# Patient Record
Sex: Male | Born: 1935 | Race: Black or African American | Hispanic: No | Marital: Married | Smoking: Never smoker
Health system: Southern US, Community
[De-identification: ages and names within clinical notes are randomized; demographics above are authoritative.]

## PROBLEM LIST (undated history)

## (undated) DIAGNOSIS — I10 Essential (primary) hypertension: Secondary | ICD-10-CM

## (undated) DIAGNOSIS — E119 Type 2 diabetes mellitus without complications: Secondary | ICD-10-CM

## (undated) DIAGNOSIS — M7989 Other specified soft tissue disorders: Secondary | ICD-10-CM

## (undated) HISTORY — PX: OTHER SURGICAL HISTORY: SHX169

## (undated) HISTORY — DX: Other specified soft tissue disorders: M79.89

## (undated) HISTORY — DX: Type 2 diabetes mellitus without complications: E11.9

## (undated) HISTORY — DX: Essential (primary) hypertension: I10

---

## 2011-02-05 ENCOUNTER — Inpatient Hospital Stay: Payer: Self-pay | Admitting: Specialist

## 2012-04-27 ENCOUNTER — Ambulatory Visit: Payer: Self-pay | Admitting: Family Medicine

## 2012-05-15 ENCOUNTER — Emergency Department: Payer: Self-pay | Admitting: Emergency Medicine

## 2012-10-29 ENCOUNTER — Ambulatory Visit: Payer: Self-pay | Admitting: Radiation Oncology

## 2012-11-09 ENCOUNTER — Emergency Department: Payer: Self-pay | Admitting: Emergency Medicine

## 2012-11-24 ENCOUNTER — Inpatient Hospital Stay: Payer: Self-pay | Admitting: Internal Medicine

## 2012-11-24 LAB — CBC WITH DIFFERENTIAL/PLATELET
Basophil %: 0.3 %
Eosinophil %: 0 %
HCT: 31.1 % — ABNORMAL LOW (ref 40.0–52.0)
HGB: 10.1 g/dL — ABNORMAL LOW (ref 13.0–18.0)
Lymphocyte #: 0.7 10*3/uL — ABNORMAL LOW (ref 1.0–3.6)
MCH: 30.5 pg (ref 26.0–34.0)
MCHC: 32.4 g/dL (ref 32.0–36.0)
MCV: 94 fL (ref 80–100)
Monocyte #: 1 x10 3/mm (ref 0.2–1.0)
Monocyte %: 8.8 %
Neutrophil %: 84.6 %
RBC: 3.31 10*6/uL — ABNORMAL LOW (ref 4.40–5.90)

## 2012-11-24 LAB — URINALYSIS, COMPLETE
Ketone: NEGATIVE
Nitrite: NEGATIVE
Protein: 100
RBC,UR: 11 /HPF (ref 0–5)
Squamous Epithelial: 3
WBC UR: 750 /HPF (ref 0–5)

## 2012-11-24 LAB — COMPREHENSIVE METABOLIC PANEL
Albumin: 3.1 g/dL — ABNORMAL LOW (ref 3.4–5.0)
Alkaline Phosphatase: 71 U/L (ref 50–136)
BUN: 61 mg/dL — ABNORMAL HIGH (ref 7–18)
Calcium, Total: 9.1 mg/dL (ref 8.5–10.1)
Chloride: 106 mmol/L (ref 98–107)
Co2: 23 mmol/L (ref 21–32)
Creatinine: 2.17 mg/dL — ABNORMAL HIGH (ref 0.60–1.30)
EGFR (African American): 31 — ABNORMAL LOW
EGFR (Non-African Amer.): 26 — ABNORMAL LOW
Osmolality: 299 (ref 275–301)
Potassium: 3.7 mmol/L (ref 3.5–5.1)
SGOT(AST): 16 U/L (ref 15–37)
Total Protein: 7.8 g/dL (ref 6.4–8.2)

## 2012-11-24 LAB — PROTIME-INR
INR: 1
Prothrombin Time: 13.6 secs (ref 11.5–14.7)

## 2012-11-25 LAB — CBC WITH DIFFERENTIAL/PLATELET
Basophil %: 0.6 %
Eosinophil #: 0 10*3/uL (ref 0.0–0.7)
Eosinophil %: 0.3 %
HGB: 9.8 g/dL — ABNORMAL LOW (ref 13.0–18.0)
MCH: 32 pg (ref 26.0–34.0)
Monocyte #: 0.9 x10 3/mm (ref 0.2–1.0)
Monocyte %: 10.1 %
Neutrophil #: 7.4 10*3/uL — ABNORMAL HIGH (ref 1.4–6.5)
RDW: 12.7 % (ref 11.5–14.5)
WBC: 9.3 10*3/uL (ref 3.8–10.6)

## 2012-11-25 LAB — BASIC METABOLIC PANEL
Anion Gap: 10 (ref 7–16)
Calcium, Total: 8.4 mg/dL — ABNORMAL LOW (ref 8.5–10.1)
Co2: 21 mmol/L (ref 21–32)
EGFR (African American): 40 — ABNORMAL LOW
EGFR (Non-African Amer.): 34 — ABNORMAL LOW
Glucose: 156 mg/dL — ABNORMAL HIGH (ref 65–99)
Osmolality: 300 (ref 275–301)
Sodium: 140 mmol/L (ref 136–145)

## 2012-11-26 LAB — CREATININE, SERUM
Creatinine: 1.29 mg/dL (ref 0.60–1.30)
EGFR (African American): 57 — ABNORMAL LOW

## 2012-11-27 LAB — HEMOGLOBIN: HGB: 9.6 g/dL — ABNORMAL LOW (ref 13.0–18.0)

## 2012-11-27 LAB — BASIC METABOLIC PANEL
Anion Gap: 7 (ref 7–16)
BUN: 22 mg/dL — ABNORMAL HIGH (ref 7–18)
Calcium, Total: 8.7 mg/dL (ref 8.5–10.1)
Co2: 24 mmol/L (ref 21–32)
Creatinine: 1.05 mg/dL (ref 0.60–1.30)
EGFR (African American): >60
EGFR (Non-African Amer.): >60
Glucose: 141 mg/dL — ABNORMAL HIGH (ref 65–99)
Osmolality: 289 (ref 275–301)
Potassium: 4.3 mmol/L (ref 3.5–5.1)

## 2012-11-27 LAB — PROTIME-INR
INR: 1.3
Prothrombin Time: 17 secs — ABNORMAL HIGH (ref 11.5–14.7)

## 2012-11-28 LAB — PROTIME-INR
INR: 1.4
Prothrombin Time: 17.6 secs — ABNORMAL HIGH (ref 11.5–14.7)

## 2012-11-29 LAB — PROTIME-INR
INR: 1.5
Prothrombin Time: 18.3 secs — ABNORMAL HIGH (ref 11.5–14.7)

## 2012-12-09 ENCOUNTER — Ambulatory Visit: Payer: Self-pay | Admitting: Hematology and Oncology

## 2012-12-11 ENCOUNTER — Ambulatory Visit: Payer: Self-pay | Admitting: Hematology and Oncology

## 2012-12-11 LAB — COMPREHENSIVE METABOLIC PANEL
Albumin: 3.3 g/dL — ABNORMAL LOW (ref 3.4–5.0)
Alkaline Phosphatase: 85 U/L (ref 50–136)
BUN: 20 mg/dL — ABNORMAL HIGH (ref 7–18)
Co2: 30 mmol/L (ref 21–32)
EGFR (African American): 55 — ABNORMAL LOW
EGFR (Non-African Amer.): 48 — ABNORMAL LOW
Glucose: 121 mg/dL — ABNORMAL HIGH (ref 65–99)
SGOT(AST): 13 U/L — ABNORMAL LOW (ref 15–37)
SGPT (ALT): 18 U/L (ref 12–78)
Total Protein: 8.1 g/dL (ref 6.4–8.2)

## 2012-12-11 LAB — CBC CANCER CENTER
Basophil #: 0.1 x10 3/mm (ref 0.0–0.1)
Eosinophil #: 0 x10 3/mm (ref 0.0–0.7)
Eosinophil %: 0.5 %
HCT: 28.8 % — ABNORMAL LOW (ref 40.0–52.0)
Lymphocyte #: 1.2 x10 3/mm (ref 1.0–3.6)
MCV: 92 fL (ref 80–100)
Monocyte #: 0.5 x10 3/mm (ref 0.2–1.0)
Monocyte %: 8.2 %
Neutrophil #: 4.5 x10 3/mm (ref 1.4–6.5)
Platelet: 514 x10 3/mm — ABNORMAL HIGH (ref 150–440)
RDW: 13.6 % (ref 11.5–14.5)

## 2012-12-11 LAB — FERRITIN: Ferritin (ARMC): 354 ng/mL (ref 8–388)

## 2012-12-11 LAB — IRON AND TIBC
Iron Bind.Cap.(Total): 247 ug/dL — ABNORMAL LOW (ref 250–450)
Iron: 47 ug/dL — ABNORMAL LOW (ref 65–175)
Unbound Iron-Bind.Cap.: 200 ug/dL

## 2012-12-11 LAB — TSH: Thyroid Stimulating Horm: 4.43 u[IU]/mL

## 2012-12-12 ENCOUNTER — Ambulatory Visit: Payer: Self-pay | Admitting: Hematology and Oncology

## 2012-12-23 LAB — CBC CANCER CENTER
Basophil #: 0 x10 3/mm (ref 0.0–0.1)
Basophil %: 0.3 %
HCT: 30.9 % — ABNORMAL LOW (ref 40.0–52.0)
HGB: 10.3 g/dL — ABNORMAL LOW (ref 13.0–18.0)
Lymphocyte #: 1.2 x10 3/mm (ref 1.0–3.6)
MCH: 30.9 pg (ref 26.0–34.0)
MCHC: 33.2 g/dL (ref 32.0–36.0)
MCV: 93 fL (ref 80–100)
Neutrophil %: 66.9 %
RBC: 3.32 10*6/uL — ABNORMAL LOW (ref 4.40–5.90)
RDW: 14.4 % (ref 11.5–14.5)
WBC: 6.1 x10 3/mm (ref 3.8–10.6)

## 2012-12-23 LAB — BASIC METABOLIC PANEL
Anion Gap: 9 (ref 7–16)
BUN: 18 mg/dL (ref 7–18)
Chloride: 103 mmol/L (ref 98–107)
Co2: 28 mmol/L (ref 21–32)
Creatinine: 1.21 mg/dL (ref 0.60–1.30)
EGFR (African American): >60
EGFR (Non-African Amer.): 54 — ABNORMAL LOW
Glucose: 144 mg/dL — ABNORMAL HIGH (ref 65–99)
Osmolality: 284 (ref 275–301)
Potassium: 4.5 mmol/L (ref 3.5–5.1)

## 2013-01-09 ENCOUNTER — Ambulatory Visit: Payer: Self-pay | Admitting: Hematology and Oncology

## 2013-01-22 LAB — CBC CANCER CENTER
Eosinophil #: 0.1 x10 3/mm (ref 0.0–0.7)
HCT: 34.9 % — ABNORMAL LOW (ref 40.0–52.0)
MCH: 30.9 pg (ref 26.0–34.0)
MCHC: 33.1 g/dL (ref 32.0–36.0)
MCV: 93 fL (ref 80–100)
Monocyte %: 11.7 %
Neutrophil #: 3 x10 3/mm (ref 1.4–6.5)
Neutrophil %: 62.1 %
Platelet: 278 x10 3/mm (ref 150–440)
RDW: 14.6 % — ABNORMAL HIGH (ref 11.5–14.5)
WBC: 4.9 x10 3/mm (ref 3.8–10.6)

## 2013-01-22 LAB — BASIC METABOLIC PANEL
Anion Gap: 9 (ref 7–16)
BUN: 25 mg/dL — ABNORMAL HIGH (ref 7–18)
Calcium, Total: 9.4 mg/dL (ref 8.5–10.1)
Chloride: 104 mmol/L (ref 98–107)
Co2: 27 mmol/L (ref 21–32)
Creatinine: 1.45 mg/dL — ABNORMAL HIGH (ref 0.60–1.30)
EGFR (African American): 50 — ABNORMAL LOW
EGFR (Non-African Amer.): 43 — ABNORMAL LOW
Glucose: 124 mg/dL — ABNORMAL HIGH (ref 65–99)
Osmolality: 285 (ref 275–301)
Potassium: 4.7 mmol/L (ref 3.5–5.1)
Sodium: 140 mmol/L (ref 136–145)

## 2013-01-23 LAB — PSA: PSA: 55.1 ng/mL — ABNORMAL HIGH (ref 0.0–4.0)

## 2013-02-09 ENCOUNTER — Ambulatory Visit: Payer: Self-pay | Admitting: Hematology and Oncology

## 2013-02-19 LAB — CBC CANCER CENTER
Eosinophil #: 0.1 x10 3/mm (ref 0.0–0.7)
Eosinophil %: 1.2 %
HCT: 33.3 % — ABNORMAL LOW (ref 40.0–52.0)
HGB: 10.8 g/dL — ABNORMAL LOW (ref 13.0–18.0)
Lymphocyte #: 1.2 x10 3/mm (ref 1.0–3.6)
MCHC: 32.5 g/dL (ref 32.0–36.0)
MCV: 92 fL (ref 80–100)
Monocyte #: 0.7 x10 3/mm (ref 0.2–1.0)
Platelet: 277 x10 3/mm (ref 150–440)
RBC: 3.64 10*6/uL — ABNORMAL LOW (ref 4.40–5.90)
RDW: 14 % (ref 11.5–14.5)

## 2013-02-19 LAB — BASIC METABOLIC PANEL
Anion Gap: 10 (ref 7–16)
BUN: 24 mg/dL — ABNORMAL HIGH (ref 7–18)
Calcium, Total: 8.6 mg/dL (ref 8.5–10.1)
Chloride: 104 mmol/L (ref 98–107)
Creatinine: 1.32 mg/dL — ABNORMAL HIGH (ref 0.60–1.30)
EGFR (African American): 55 — ABNORMAL LOW
EGFR (Non-African Amer.): 48 — ABNORMAL LOW
Glucose: 119 mg/dL — ABNORMAL HIGH (ref 65–99)
Osmolality: 281 (ref 275–301)
Sodium: 138 mmol/L (ref 136–145)

## 2013-03-11 ENCOUNTER — Ambulatory Visit: Payer: Self-pay | Admitting: Hematology and Oncology

## 2013-03-19 LAB — CBC CANCER CENTER
Basophil #: 0 x10 3/mm (ref 0.0–0.1)
Eosinophil #: 0.1 x10 3/mm (ref 0.0–0.7)
Eosinophil %: 1.9 %
HCT: 32.1 % — ABNORMAL LOW (ref 40.0–52.0)
HGB: 10.7 g/dL — ABNORMAL LOW (ref 13.0–18.0)
Lymphocyte #: 1.4 x10 3/mm (ref 1.0–3.6)
Lymphocyte %: 28.1 %
MCH: 30.3 pg (ref 26.0–34.0)
MCV: 91 fL (ref 80–100)
Monocyte %: 12.8 %
Neutrophil #: 2.9 x10 3/mm (ref 1.4–6.5)
Neutrophil %: 56.4 %
Platelet: 256 x10 3/mm (ref 150–440)
RBC: 3.53 10*6/uL — ABNORMAL LOW (ref 4.40–5.90)

## 2013-03-19 LAB — COMPREHENSIVE METABOLIC PANEL
Albumin: 3.9 g/dL (ref 3.4–5.0)
Bilirubin,Total: 0.3 mg/dL (ref 0.2–1.0)
SGPT (ALT): 22 U/L (ref 12–78)

## 2013-03-19 LAB — BASIC METABOLIC PANEL
Anion Gap: 10 (ref 7–16)
BUN: 31 mg/dL — ABNORMAL HIGH (ref 7–18)
Chloride: 104 mmol/L (ref 98–107)
Co2: 25 mmol/L (ref 21–32)
EGFR (Non-African Amer.): 40 — ABNORMAL LOW
Glucose: 121 mg/dL — ABNORMAL HIGH (ref 65–99)
Osmolality: 285 (ref 275–301)
Sodium: 139 mmol/L (ref 136–145)

## 2013-03-20 LAB — PSA: PSA: 3.1 ng/mL (ref 0.0–4.0)

## 2013-04-11 ENCOUNTER — Ambulatory Visit: Payer: Self-pay | Admitting: Hematology and Oncology

## 2013-04-16 LAB — CBC CANCER CENTER
Basophil %: 1.3 %
Eosinophil #: 0.1 x10 3/mm (ref 0.0–0.7)
Eosinophil %: 2.3 %
HCT: 29.2 % — ABNORMAL LOW (ref 40.0–52.0)
Lymphocyte #: 1.3 x10 3/mm (ref 1.0–3.6)
MCHC: 33.5 g/dL (ref 32.0–36.0)
MCV: 93 fL (ref 80–100)
RDW: 13.5 % (ref 11.5–14.5)
WBC: 5 x10 3/mm (ref 3.8–10.6)

## 2013-04-16 LAB — BASIC METABOLIC PANEL
Anion Gap: 6 — ABNORMAL LOW (ref 7–16)
Calcium, Total: 9.2 mg/dL (ref 8.5–10.1)
EGFR (Non-African Amer.): 31 — ABNORMAL LOW
Osmolality: 280 (ref 275–301)
Sodium: 134 mmol/L — ABNORMAL LOW (ref 136–145)

## 2013-04-17 LAB — PSA: PSA: 2.4 ng/mL (ref 0.0–4.0)

## 2013-05-11 ENCOUNTER — Ambulatory Visit: Payer: Self-pay | Admitting: Hematology and Oncology

## 2013-05-21 LAB — BASIC METABOLIC PANEL
BUN: 39 mg/dL — ABNORMAL HIGH (ref 7–18)
Chloride: 101 mmol/L (ref 98–107)
Creatinine: 2.09 mg/dL — ABNORMAL HIGH (ref 0.60–1.30)
EGFR (African American): 32 — ABNORMAL LOW
EGFR (Non-African Amer.): 27 — ABNORMAL LOW
Glucose: 117 mg/dL — ABNORMAL HIGH (ref 65–99)
Potassium: 4.8 mmol/L (ref 3.5–5.1)
Sodium: 137 mmol/L (ref 136–145)

## 2013-05-21 LAB — CBC CANCER CENTER
Basophil #: 0.1 x10 3/mm (ref 0.0–0.1)
Basophil %: 1.4 %
Eosinophil #: 0.1 x10 3/mm (ref 0.0–0.7)
Eosinophil %: 1.6 %
HCT: 28.6 % — ABNORMAL LOW (ref 40.0–52.0)
Lymphocyte #: 1.2 x10 3/mm (ref 1.0–3.6)
Lymphocyte %: 22 %
MCH: 31.9 pg (ref 26.0–34.0)
MCV: 93 fL (ref 80–100)
Monocyte #: 0.6 x10 3/mm (ref 0.2–1.0)
Neutrophil #: 3.3 x10 3/mm (ref 1.4–6.5)
Platelet: 270 x10 3/mm (ref 150–440)
RBC: 3.07 10*6/uL — ABNORMAL LOW (ref 4.40–5.90)
WBC: 5.3 x10 3/mm (ref 3.8–10.6)

## 2013-05-22 LAB — PSA: PSA: 2.4 ng/mL (ref 0.0–4.0)

## 2013-06-11 ENCOUNTER — Ambulatory Visit: Payer: Self-pay | Admitting: Hematology and Oncology

## 2013-07-12 ENCOUNTER — Ambulatory Visit: Payer: Self-pay | Admitting: Hematology and Oncology

## 2013-07-16 LAB — CBC CANCER CENTER
HCT: 29.3 % — ABNORMAL LOW (ref 40.0–52.0)
Lymphocyte #: 1.2 x10 3/mm (ref 1.0–3.6)
MCH: 31.1 pg (ref 26.0–34.0)
MCHC: 33.5 g/dL (ref 32.0–36.0)
MCV: 93 fL (ref 80–100)
Monocyte %: 13.9 %
Neutrophil %: 57.5 %
RBC: 3.16 10*6/uL — ABNORMAL LOW (ref 4.40–5.90)
RDW: 13 % (ref 11.5–14.5)

## 2013-07-16 LAB — BASIC METABOLIC PANEL
Calcium, Total: 9.1 mg/dL (ref 8.5–10.1)
Chloride: 104 mmol/L (ref 98–107)
Co2: 24 mmol/L (ref 21–32)
Osmolality: 293 (ref 275–301)

## 2013-08-11 ENCOUNTER — Ambulatory Visit: Payer: Self-pay | Admitting: Hematology and Oncology

## 2013-08-13 LAB — BASIC METABOLIC PANEL
Anion Gap: 10 (ref 7–16)
Chloride: 102 mmol/L (ref 98–107)
Co2: 26 mmol/L (ref 21–32)
EGFR (African American): 44 — ABNORMAL LOW
Glucose: 278 mg/dL — ABNORMAL HIGH (ref 65–99)
Osmolality: 292 (ref 275–301)

## 2013-08-13 LAB — CBC CANCER CENTER
Eosinophil #: 0.1 x10 3/mm (ref 0.0–0.7)
HGB: 9.9 g/dL — ABNORMAL LOW (ref 13.0–18.0)
Lymphocyte #: 0.9 x10 3/mm — ABNORMAL LOW (ref 1.0–3.6)
MCHC: 33.4 g/dL (ref 32.0–36.0)
MCV: 93 fL (ref 80–100)
Monocyte #: 0.5 x10 3/mm (ref 0.2–1.0)
Monocyte %: 10.5 %
RBC: 3.2 10*6/uL — ABNORMAL LOW (ref 4.40–5.90)
RDW: 13.6 % (ref 11.5–14.5)

## 2013-09-11 ENCOUNTER — Ambulatory Visit: Payer: Self-pay | Admitting: Hematology and Oncology

## 2013-10-08 LAB — BASIC METABOLIC PANEL
Anion Gap: 11 (ref 7–16)
Co2: 25 mmol/L (ref 21–32)
Creatinine: 1.89 mg/dL — ABNORMAL HIGH (ref 0.60–1.30)
Osmolality: 289 (ref 275–301)
Sodium: 137 mmol/L (ref 136–145)

## 2013-10-08 LAB — CBC CANCER CENTER
Basophil %: 1.2 %
Eosinophil #: 0.1 x10 3/mm (ref 0.0–0.7)
Eosinophil %: 1.9 %
HCT: 29.5 % — ABNORMAL LOW (ref 40.0–52.0)
MCHC: 32.7 g/dL (ref 32.0–36.0)
MCV: 91 fL (ref 80–100)
Monocyte #: 0.5 x10 3/mm (ref 0.2–1.0)
Monocyte %: 11.3 %
Neutrophil #: 3.2 x10 3/mm (ref 1.4–6.5)
RBC: 3.24 10*6/uL — ABNORMAL LOW (ref 4.40–5.90)

## 2013-10-11 ENCOUNTER — Ambulatory Visit: Payer: Self-pay | Admitting: Hematology and Oncology

## 2013-10-11 LAB — PSA: PSA: 1.8 ng/mL (ref 0.0–4.0)

## 2013-11-05 LAB — BASIC METABOLIC PANEL
Anion Gap: 9 (ref 7–16)
Calcium, Total: 8.7 mg/dL (ref 8.5–10.1)
Chloride: 105 mmol/L (ref 98–107)
Co2: 24 mmol/L (ref 21–32)
Creatinine: 2.2 mg/dL — ABNORMAL HIGH (ref 0.60–1.30)
EGFR (African American): 30 — ABNORMAL LOW
EGFR (Non-African Amer.): 26 — ABNORMAL LOW
Potassium: 4.4 mmol/L (ref 3.5–5.1)
Sodium: 138 mmol/L (ref 136–145)

## 2013-11-05 LAB — CBC CANCER CENTER
Basophil %: 0.6 %
Eosinophil #: 0.1 x10 3/mm (ref 0.0–0.7)
HCT: 28.7 % — ABNORMAL LOW (ref 40.0–52.0)
HGB: 9.3 g/dL — ABNORMAL LOW (ref 13.0–18.0)
Lymphocyte #: 1.3 x10 3/mm (ref 1.0–3.6)
MCV: 90 fL (ref 80–100)
Monocyte #: 0.8 x10 3/mm (ref 0.2–1.0)
Monocyte %: 12.9 %
Neutrophil #: 4.1 x10 3/mm (ref 1.4–6.5)
Platelet: 298 x10 3/mm (ref 150–440)
RBC: 3.19 10*6/uL — ABNORMAL LOW (ref 4.40–5.90)
RDW: 14.2 % (ref 11.5–14.5)
WBC: 6.3 x10 3/mm (ref 3.8–10.6)

## 2013-11-08 LAB — PSA: PSA: 1.3 ng/mL (ref 0.0–4.0)

## 2013-11-11 ENCOUNTER — Ambulatory Visit: Payer: Self-pay | Admitting: Hematology and Oncology

## 2013-11-16 LAB — BASIC METABOLIC PANEL
ANION GAP: 13 (ref 7–16)
BUN: 32 mg/dL — AB (ref 7–18)
CO2: 23 mmol/L (ref 21–32)
Calcium, Total: 9.5 mg/dL (ref 8.5–10.1)
Chloride: 103 mmol/L (ref 98–107)
Creatinine: 1.76 mg/dL — ABNORMAL HIGH (ref 0.60–1.30)
EGFR (African American): 39 — ABNORMAL LOW
EGFR (Non-African Amer.): 34 — ABNORMAL LOW
GLUCOSE: 133 mg/dL — AB (ref 65–99)
Osmolality: 286 (ref 275–301)
POTASSIUM: 4.6 mmol/L (ref 3.5–5.1)
Sodium: 139 mmol/L (ref 136–145)

## 2013-11-16 LAB — CBC CANCER CENTER
Basophil #: 0 x10 3/mm (ref 0.0–0.1)
Basophil %: 0.3 %
Eosinophil #: 0.1 x10 3/mm (ref 0.0–0.7)
Eosinophil %: 2.1 %
HCT: 29.9 % — ABNORMAL LOW (ref 40.0–52.0)
HGB: 9.4 g/dL — ABNORMAL LOW (ref 13.0–18.0)
Lymphocyte #: 1.4 x10 3/mm (ref 1.0–3.6)
Lymphocyte %: 21.8 %
MCH: 28.7 pg (ref 26.0–34.0)
MCHC: 31.6 g/dL — ABNORMAL LOW (ref 32.0–36.0)
MCV: 91 fL (ref 80–100)
Monocyte #: 0.6 x10 3/mm (ref 0.2–1.0)
Monocyte %: 8.8 %
Neutrophil #: 4.2 x10 3/mm (ref 1.4–6.5)
Neutrophil %: 67 %
Platelet: 367 x10 3/mm (ref 150–440)
RBC: 3.29 10*6/uL — ABNORMAL LOW (ref 4.40–5.90)
RDW: 14.2 % (ref 11.5–14.5)
WBC: 6.3 x10 3/mm (ref 3.8–10.6)

## 2013-11-17 ENCOUNTER — Encounter: Payer: Self-pay | Admitting: Podiatry

## 2013-11-17 LAB — PSA: PSA: 1.3 ng/mL (ref 0.0–4.0)

## 2013-11-22 ENCOUNTER — Ambulatory Visit: Payer: Self-pay | Admitting: Podiatry

## 2013-11-24 ENCOUNTER — Ambulatory Visit: Payer: Self-pay | Admitting: Podiatry

## 2013-12-12 ENCOUNTER — Ambulatory Visit: Payer: Self-pay

## 2013-12-12 ENCOUNTER — Ambulatory Visit: Payer: Self-pay | Admitting: Hematology and Oncology

## 2013-12-14 LAB — CBC CANCER CENTER
Basophil #: 0 x10 3/mm (ref 0.0–0.1)
Basophil %: 0.4 %
Eosinophil #: 0.1 x10 3/mm (ref 0.0–0.7)
Eosinophil %: 2.2 %
HCT: 30.2 % — ABNORMAL LOW (ref 40.0–52.0)
HGB: 9.7 g/dL — ABNORMAL LOW (ref 13.0–18.0)
LYMPHS ABS: 1.3 x10 3/mm (ref 1.0–3.6)
LYMPHS PCT: 22 %
MCH: 28.7 pg (ref 26.0–34.0)
MCHC: 32 g/dL (ref 32.0–36.0)
MCV: 90 fL (ref 80–100)
MONO ABS: 0.6 x10 3/mm (ref 0.2–1.0)
Monocyte %: 10.9 %
Neutrophil #: 3.7 x10 3/mm (ref 1.4–6.5)
Neutrophil %: 64.5 %
Platelet: 281 x10 3/mm (ref 150–440)
RBC: 3.38 10*6/uL — AB (ref 4.40–5.90)
RDW: 14.3 % (ref 11.5–14.5)
WBC: 5.7 x10 3/mm (ref 3.8–10.6)

## 2013-12-14 LAB — BASIC METABOLIC PANEL
Anion Gap: 12 (ref 7–16)
BUN: 38 mg/dL — ABNORMAL HIGH (ref 7–18)
CALCIUM: 8.5 mg/dL (ref 8.5–10.1)
CO2: 22 mmol/L (ref 21–32)
Chloride: 105 mmol/L (ref 98–107)
Creatinine: 1.9 mg/dL — ABNORMAL HIGH (ref 0.60–1.30)
GFR CALC AF AMER: 36 — AB
GFR CALC NON AF AMER: 31 — AB
Glucose: 115 mg/dL — ABNORMAL HIGH (ref 65–99)
Osmolality: 288 (ref 275–301)
Potassium: 4.4 mmol/L (ref 3.5–5.1)
SODIUM: 139 mmol/L (ref 136–145)

## 2013-12-29 ENCOUNTER — Ambulatory Visit (INDEPENDENT_AMBULATORY_CARE_PROVIDER_SITE_OTHER): Payer: Commercial Managed Care - HMO | Admitting: Podiatry

## 2013-12-29 ENCOUNTER — Encounter: Payer: Self-pay | Admitting: Podiatry

## 2013-12-29 VITALS — BP 112/74 | HR 76 | Resp 16 | Ht 68.0 in | Wt 178.0 lb

## 2013-12-29 DIAGNOSIS — B351 Tinea unguium: Secondary | ICD-10-CM

## 2013-12-29 DIAGNOSIS — M79609 Pain in unspecified limb: Secondary | ICD-10-CM

## 2013-12-29 NOTE — Progress Notes (Signed)
Trim my  Toenails because they hurt him in a walk and he comes in.  Objective: Vital signs are stable he is alert and oriented x3. Pulses are palpable bilateral. Nails are thick yellow dystrophic lytic mycotic painful palpation. No lesions noted on the skin.  Assessment: Pain in limb secondary to onychomycosis 1 through 5 bilateral.  Plan: Debridement of nails 1 through 5 bilateral covered service secondary to pain I suggested he followup with [place] in 3 months.

## 2014-01-09 ENCOUNTER — Ambulatory Visit: Payer: Self-pay | Admitting: Hematology and Oncology

## 2014-01-11 LAB — CBC CANCER CENTER
BASOS ABS: 0 x10 3/mm (ref 0.0–0.1)
BASOS PCT: 0.5 %
Eosinophil #: 0.2 x10 3/mm (ref 0.0–0.7)
Eosinophil %: 3.5 %
HCT: 28 % — ABNORMAL LOW (ref 40.0–52.0)
HGB: 8.8 g/dL — AB (ref 13.0–18.0)
LYMPHS ABS: 1.4 x10 3/mm (ref 1.0–3.6)
Lymphocyte %: 28.2 %
MCH: 28.3 pg (ref 26.0–34.0)
MCHC: 31.6 g/dL — ABNORMAL LOW (ref 32.0–36.0)
MCV: 90 fL (ref 80–100)
MONO ABS: 0.5 x10 3/mm (ref 0.2–1.0)
Monocyte %: 10.1 %
Neutrophil #: 2.8 x10 3/mm (ref 1.4–6.5)
Neutrophil %: 57.7 %
Platelet: 254 x10 3/mm (ref 150–440)
RBC: 3.12 10*6/uL — ABNORMAL LOW (ref 4.40–5.90)
RDW: 15 % — AB (ref 11.5–14.5)
WBC: 4.9 x10 3/mm (ref 3.8–10.6)

## 2014-01-11 LAB — BASIC METABOLIC PANEL
Anion Gap: 13 (ref 7–16)
BUN: 53 mg/dL — ABNORMAL HIGH (ref 7–18)
CREATININE: 2.47 mg/dL — AB (ref 0.60–1.30)
Calcium, Total: 7.5 mg/dL — ABNORMAL LOW (ref 8.5–10.1)
Chloride: 109 mmol/L — ABNORMAL HIGH (ref 98–107)
Co2: 19 mmol/L — ABNORMAL LOW (ref 21–32)
EGFR (Non-African Amer.): 22 — ABNORMAL LOW
GFR CALC AF AMER: 26 — AB
Glucose: 156 mg/dL — ABNORMAL HIGH (ref 65–99)
Osmolality: 299 (ref 275–301)
POTASSIUM: 4.4 mmol/L (ref 3.5–5.1)
SODIUM: 141 mmol/L (ref 136–145)

## 2014-02-09 ENCOUNTER — Ambulatory Visit: Payer: Self-pay | Admitting: Hematology and Oncology

## 2014-02-11 LAB — CBC CANCER CENTER
Basophil #: 0 x10 3/mm (ref 0.0–0.1)
Basophil %: 0.9 %
EOS ABS: 0.2 x10 3/mm (ref 0.0–0.7)
Eosinophil %: 3.5 %
HCT: 27.5 % — ABNORMAL LOW (ref 40.0–52.0)
HGB: 8.7 g/dL — AB (ref 13.0–18.0)
LYMPHS ABS: 1.2 x10 3/mm (ref 1.0–3.6)
Lymphocyte %: 23.6 %
MCH: 28.6 pg (ref 26.0–34.0)
MCHC: 31.6 g/dL — AB (ref 32.0–36.0)
MCV: 91 fL (ref 80–100)
Monocyte #: 0.6 x10 3/mm (ref 0.2–1.0)
Monocyte %: 12.4 %
NEUTROS ABS: 3 x10 3/mm (ref 1.4–6.5)
Neutrophil %: 59.6 %
Platelet: 271 x10 3/mm (ref 150–440)
RBC: 3.03 10*6/uL — ABNORMAL LOW (ref 4.40–5.90)
RDW: 15.5 % — ABNORMAL HIGH (ref 11.5–14.5)
WBC: 5.1 x10 3/mm (ref 3.8–10.6)

## 2014-02-11 LAB — BASIC METABOLIC PANEL
ANION GAP: 8 (ref 7–16)
BUN: 37 mg/dL — ABNORMAL HIGH (ref 7–18)
CHLORIDE: 106 mmol/L (ref 98–107)
CO2: 23 mmol/L (ref 21–32)
CREATININE: 2.05 mg/dL — AB (ref 0.60–1.30)
Calcium, Total: 9.2 mg/dL (ref 8.5–10.1)
EGFR (African American): 32 — ABNORMAL LOW
EGFR (Non-African Amer.): 28 — ABNORMAL LOW
Glucose: 156 mg/dL — ABNORMAL HIGH (ref 65–99)
Osmolality: 286 (ref 275–301)
Potassium: 5.1 mmol/L (ref 3.5–5.1)
Sodium: 137 mmol/L (ref 136–145)

## 2014-03-11 ENCOUNTER — Ambulatory Visit: Payer: Self-pay | Admitting: Hematology and Oncology

## 2014-03-15 LAB — BASIC METABOLIC PANEL
Anion Gap: 9 (ref 7–16)
BUN: 54 mg/dL — ABNORMAL HIGH (ref 7–18)
CREATININE: 2.3 mg/dL — AB (ref 0.60–1.30)
Calcium, Total: 8.6 mg/dL (ref 8.5–10.1)
Chloride: 108 mmol/L — ABNORMAL HIGH (ref 98–107)
Co2: 21 mmol/L (ref 21–32)
EGFR (African American): 28 — ABNORMAL LOW
EGFR (Non-African Amer.): 24 — ABNORMAL LOW
Glucose: 183 mg/dL — ABNORMAL HIGH (ref 65–99)
Osmolality: 295 (ref 275–301)
POTASSIUM: 5.4 mmol/L — AB (ref 3.5–5.1)
Sodium: 138 mmol/L (ref 136–145)

## 2014-03-15 LAB — CBC CANCER CENTER
BASOS ABS: 0 x10 3/mm (ref 0.0–0.1)
Basophil %: 0.8 %
EOS ABS: 0.2 x10 3/mm (ref 0.0–0.7)
EOS PCT: 3.7 %
HCT: 28.9 % — ABNORMAL LOW (ref 40.0–52.0)
HGB: 9.3 g/dL — ABNORMAL LOW (ref 13.0–18.0)
Lymphocyte #: 1.4 x10 3/mm (ref 1.0–3.6)
Lymphocyte %: 25.7 %
MCH: 29.7 pg (ref 26.0–34.0)
MCHC: 32.1 g/dL (ref 32.0–36.0)
MCV: 92 fL (ref 80–100)
Monocyte #: 0.6 x10 3/mm (ref 0.2–1.0)
Monocyte %: 10.4 %
NEUTROS PCT: 59.4 %
Neutrophil #: 3.2 x10 3/mm (ref 1.4–6.5)
PLATELETS: 240 x10 3/mm (ref 150–440)
RBC: 3.13 10*6/uL — AB (ref 4.40–5.90)
RDW: 15.3 % — ABNORMAL HIGH (ref 11.5–14.5)
WBC: 5.5 x10 3/mm (ref 3.8–10.6)

## 2014-03-23 ENCOUNTER — Ambulatory Visit: Payer: Commercial Managed Care - HMO | Admitting: Podiatry

## 2014-04-08 LAB — BASIC METABOLIC PANEL
ANION GAP: 12 (ref 7–16)
BUN: 40 mg/dL — AB (ref 7–18)
CO2: 21 mmol/L (ref 21–32)
CREATININE: 2.2 mg/dL — AB (ref 0.60–1.30)
Calcium, Total: 8.6 mg/dL (ref 8.5–10.1)
Chloride: 109 mmol/L — ABNORMAL HIGH (ref 98–107)
EGFR (African American): 30 — ABNORMAL LOW
GFR CALC NON AF AMER: 26 — AB
Glucose: 125 mg/dL — ABNORMAL HIGH (ref 65–99)
Osmolality: 294 (ref 275–301)
POTASSIUM: 5.9 mmol/L — AB (ref 3.5–5.1)
Sodium: 142 mmol/L (ref 136–145)

## 2014-04-08 LAB — CBC CANCER CENTER
Basophil #: 0.1 x10 3/mm (ref 0.0–0.1)
Basophil %: 0.9 %
Eosinophil #: 0.1 x10 3/mm (ref 0.0–0.7)
Eosinophil %: 1.7 %
HCT: 30.4 % — ABNORMAL LOW (ref 40.0–52.0)
HGB: 9.8 g/dL — AB (ref 13.0–18.0)
LYMPHS PCT: 16.8 %
Lymphocyte #: 1.2 x10 3/mm (ref 1.0–3.6)
MCH: 30.3 pg (ref 26.0–34.0)
MCHC: 32.3 g/dL (ref 32.0–36.0)
MCV: 94 fL (ref 80–100)
MONO ABS: 0.8 x10 3/mm (ref 0.2–1.0)
MONOS PCT: 11.6 %
NEUTROS PCT: 69 %
Neutrophil #: 4.9 x10 3/mm (ref 1.4–6.5)
PLATELETS: 324 x10 3/mm (ref 150–440)
RBC: 3.24 10*6/uL — AB (ref 4.40–5.90)
RDW: 14.6 % — ABNORMAL HIGH (ref 11.5–14.5)
WBC: 7 x10 3/mm (ref 3.8–10.6)

## 2014-04-11 ENCOUNTER — Ambulatory Visit: Payer: Self-pay | Admitting: Hematology and Oncology

## 2014-04-12 ENCOUNTER — Ambulatory Visit (INDEPENDENT_AMBULATORY_CARE_PROVIDER_SITE_OTHER): Payer: Commercial Managed Care - HMO

## 2014-04-12 ENCOUNTER — Ambulatory Visit (INDEPENDENT_AMBULATORY_CARE_PROVIDER_SITE_OTHER): Payer: Commercial Managed Care - HMO | Admitting: Podiatry

## 2014-04-12 VITALS — BP 121/64 | HR 69 | Resp 16

## 2014-04-12 DIAGNOSIS — L97509 Non-pressure chronic ulcer of other part of unspecified foot with unspecified severity: Secondary | ICD-10-CM

## 2014-04-12 DIAGNOSIS — M2040 Other hammer toe(s) (acquired), unspecified foot: Secondary | ICD-10-CM

## 2014-04-12 NOTE — Progress Notes (Signed)
Subjective:     Patient ID: Jacob Hodges, male   DOB: 16-Oct-1923, 80 y.o.   MRN: [id number]  HPI patient presents stating for the last couple weeks he has had pain underneath his fifth toe on his right foot without remembering any kind of injury   Review of Systems     Objective:   Physical Exam Neurovascular status is unchanged from previous visits with a   fissure in the plantar aspect of the right fifth toe that most likely was caused by trauma. It is localized without any proximal edema erythema or drainage Assessment:     Localized future ulceration plantar aspect right fifth toe of the localized nature    Plan:     H&P and x-ray reviewed. I applied Iodosorb to try to dry the area and sterile dressing and he will keep this on for the next week and then utilized Neosporin at home. If it does not heal it may require amputation of toe

## 2014-04-20 ENCOUNTER — Emergency Department: Payer: Self-pay | Admitting: Emergency Medicine

## 2014-04-25 ENCOUNTER — Telehealth: Payer: Self-pay | Admitting: *Deleted

## 2014-04-25 NOTE — Telephone Encounter (Signed)
Kim called regarding patient ,wanting to know if dr regal referred him to wound clinic , stating that the patient just showed up there. She requested our last chart note for Jacob Hodges so that they may refer him to the wound clinic

## 2014-04-26 ENCOUNTER — Ambulatory Visit (INDEPENDENT_AMBULATORY_CARE_PROVIDER_SITE_OTHER): Payer: Commercial Managed Care - HMO

## 2014-04-26 ENCOUNTER — Ambulatory Visit (INDEPENDENT_AMBULATORY_CARE_PROVIDER_SITE_OTHER): Payer: Commercial Managed Care - HMO | Admitting: Podiatry

## 2014-04-26 VITALS — BP 99/64 | HR 80 | Resp 16

## 2014-04-26 DIAGNOSIS — E119 Type 2 diabetes mellitus without complications: Secondary | ICD-10-CM

## 2014-04-26 DIAGNOSIS — L02619 Cutaneous abscess of unspecified foot: Secondary | ICD-10-CM

## 2014-04-26 DIAGNOSIS — L97509 Non-pressure chronic ulcer of other part of unspecified foot with unspecified severity: Secondary | ICD-10-CM

## 2014-04-26 DIAGNOSIS — L03039 Cellulitis of unspecified toe: Secondary | ICD-10-CM

## 2014-04-26 MED ORDER — DOXYCYCLINE HYCLATE 100 MG PO TABS: 100.0000 mg | ORAL_TABLET | Freq: Two times a day (BID) | ORAL | Status: DC

## 2014-04-26 MED ORDER — CIPROFLOXACIN HCL 500 MG PO TABS: 500.0000 mg | ORAL_TABLET | Freq: Two times a day (BID) | ORAL | Status: DC

## 2014-04-26 MED ORDER — OXYCODONE-ACETAMINOPHEN 10-325 MG PO TABS: 1.0000 | ORAL_TABLET | Freq: Three times a day (TID) | ORAL | Status: DC | PRN

## 2014-04-26 NOTE — Progress Notes (Signed)
Subjective:     Patient ID: Jacob Hodges, male   DOB: Jan 31, 1923, 80 y.o.   MRN: [id number]  HPI patient states that the toe is still hurting me and it feels like it has changed and become darker   Review of Systems     Objective:   Physical Exam I found there to be significant diminishment of vascular status on the right side over left with a fifth toe right which has developed a very dark color and upon evaluating plantar I noted that the bone is now exposed and the head of the proximal phalanx is sticking through the wound. I also noted drainage in the area which appears localized with no proximal erythema edema lymph node distention or signs of systemic infection    Assessment:     Acute situation with gangrene of the right fifth toe with bone exposure and possibility of osteomyelitis. Patient has significant vascular issues and before we can decide where amputation will occur he does need vascular analysis which I am sending him over in a stat fashion for this patient    Plan:     Refer to vascular doctors for evaluation tomorrow placed on combination antibiotic of clindamycin and Cipro and explained to him and his wife that he's going to require amputation we just don't know what level at this point

## 2014-04-28 ENCOUNTER — Ambulatory Visit: Payer: Self-pay | Admitting: Vascular Surgery

## 2014-04-28 DIAGNOSIS — I10 Essential (primary) hypertension: Secondary | ICD-10-CM

## 2014-04-28 LAB — BASIC METABOLIC PANEL
ANION GAP: 8 (ref 7–16)
BUN: 56 mg/dL — ABNORMAL HIGH (ref 7–18)
Calcium, Total: 9.4 mg/dL (ref 8.5–10.1)
Chloride: 107 mmol/L (ref 98–107)
Co2: 18 mmol/L — ABNORMAL LOW (ref 21–32)
Creatinine: 2.83 mg/dL — ABNORMAL HIGH (ref 0.60–1.30)
GFR CALC AF AMER: 22 — AB
GFR CALC NON AF AMER: 19 — AB
GLUCOSE: 100 mg/dL — AB (ref 65–99)
Osmolality: 282 (ref 275–301)
POTASSIUM: 5.4 mmol/L — AB (ref 3.5–5.1)
SODIUM: 133 mmol/L — AB (ref 136–145)

## 2014-05-03 ENCOUNTER — Inpatient Hospital Stay: Payer: Self-pay | Admitting: Internal Medicine

## 2014-05-03 LAB — BASIC METABOLIC PANEL
ANION GAP: 10 (ref 7–16)
BUN: 48 mg/dL — AB (ref 7–18)
CHLORIDE: 106 mmol/L (ref 98–107)
Calcium, Total: 9.1 mg/dL (ref 8.5–10.1)
Co2: 15 mmol/L — ABNORMAL LOW (ref 21–32)
Creatinine: 3.07 mg/dL — ABNORMAL HIGH (ref 0.60–1.30)
EGFR (Non-African Amer.): 17 — ABNORMAL LOW
GFR CALC AF AMER: 20 — AB
Glucose: 113 mg/dL — ABNORMAL HIGH (ref 65–99)
Osmolality: 276 (ref 275–301)
POTASSIUM: 5.4 mmol/L — AB (ref 3.5–5.1)
Sodium: 131 mmol/L — ABNORMAL LOW (ref 136–145)

## 2014-05-03 LAB — PROTIME-INR

## 2014-05-03 LAB — POTASSIUM: POTASSIUM: 4.6 mmol/L (ref 3.5–5.1)

## 2014-05-03 LAB — GLUCOSE, RANDOM: Glucose: 44 mg/dL — ABNORMAL LOW (ref 65–99)

## 2014-05-04 LAB — BASIC METABOLIC PANEL
Anion Gap: 8 (ref 7–16)
BUN: 38 mg/dL — ABNORMAL HIGH (ref 7–18)
Calcium, Total: 8.3 mg/dL — ABNORMAL LOW (ref 8.5–10.1)
Chloride: 109 mmol/L — ABNORMAL HIGH (ref 98–107)
Co2: 17 mmol/L — ABNORMAL LOW (ref 21–32)
Creatinine: 2.44 mg/dL — ABNORMAL HIGH (ref 0.60–1.30)
EGFR (African American): 26 — ABNORMAL LOW
EGFR (Non-African Amer.): 23 — ABNORMAL LOW
Glucose: 96 mg/dL (ref 65–99)
Osmolality: 277 (ref 275–301)
Potassium: 5 mmol/L (ref 3.5–5.1)
Sodium: 134 mmol/L — ABNORMAL LOW (ref 136–145)

## 2014-05-04 LAB — CBC WITH DIFFERENTIAL/PLATELET
BASOS PCT: 0.2 %
Basophil #: 0 10*3/uL (ref 0.0–0.1)
Eosinophil #: 0.1 10*3/uL (ref 0.0–0.7)
Eosinophil %: 0.6 %
HCT: 28.3 % — ABNORMAL LOW (ref 40.0–52.0)
HGB: 9.2 g/dL — ABNORMAL LOW (ref 13.0–18.0)
LYMPHS PCT: 10.8 %
Lymphocyte #: 0.9 10*3/uL — ABNORMAL LOW (ref 1.0–3.6)
MCH: 29.8 pg (ref 26.0–34.0)
MCHC: 32.6 g/dL (ref 32.0–36.0)
MCV: 92 fL (ref 80–100)
MONOS PCT: 12.7 %
Monocyte #: 1.1 x10 3/mm — ABNORMAL HIGH (ref 0.2–1.0)
Neutrophil #: 6.7 10*3/uL — ABNORMAL HIGH (ref 1.4–6.5)
Neutrophil %: 75.7 %
Platelet: 369 10*3/uL (ref 150–440)
RBC: 3.09 10*6/uL — ABNORMAL LOW (ref 4.40–5.90)
RDW: 15.2 % — AB (ref 11.5–14.5)
WBC: 8.8 10*3/uL (ref 3.8–10.6)

## 2014-05-04 LAB — PROTIME-INR
INR: >15.1
INR: 12.4
Prothrombin Time: 89.4 secs — ABNORMAL HIGH (ref 11.5–14.7)

## 2014-05-05 LAB — BASIC METABOLIC PANEL
Anion Gap: 10 (ref 7–16)
BUN: 38 mg/dL — ABNORMAL HIGH (ref 7–18)
CALCIUM: 7.9 mg/dL — AB (ref 8.5–10.1)
CHLORIDE: 107 mmol/L (ref 98–107)
Co2: 17 mmol/L — ABNORMAL LOW (ref 21–32)
Creatinine: 2.09 mg/dL — ABNORMAL HIGH (ref 0.60–1.30)
GFR CALC AF AMER: 32 — AB
GFR CALC NON AF AMER: 27 — AB
GLUCOSE: 134 mg/dL — AB (ref 65–99)
Osmolality: 279 (ref 275–301)
POTASSIUM: 4.8 mmol/L (ref 3.5–5.1)
Sodium: 134 mmol/L — ABNORMAL LOW (ref 136–145)

## 2014-05-05 LAB — CBC WITH DIFFERENTIAL/PLATELET
Basophil #: 0 10*3/uL (ref 0.0–0.1)
Basophil %: 0.2 %
EOS ABS: 0 10*3/uL (ref 0.0–0.7)
Eosinophil %: 0.2 %
HCT: 28.7 % — ABNORMAL LOW (ref 40.0–52.0)
HGB: 9.1 g/dL — ABNORMAL LOW (ref 13.0–18.0)
Lymphocyte #: 0.9 10*3/uL — ABNORMAL LOW (ref 1.0–3.6)
Lymphocyte %: 6.3 %
MCH: 28.9 pg (ref 26.0–34.0)
MCHC: 31.8 g/dL — ABNORMAL LOW (ref 32.0–36.0)
MCV: 91 fL (ref 80–100)
MONO ABS: 1.4 x10 3/mm — AB (ref 0.2–1.0)
Monocyte %: 10 %
Neutrophil #: 11.8 10*3/uL — ABNORMAL HIGH (ref 1.4–6.5)
Neutrophil %: 83.3 %
Platelet: 397 10*3/uL (ref 150–440)
RBC: 3.17 10*6/uL — ABNORMAL LOW (ref 4.40–5.90)
RDW: 14.5 % (ref 11.5–14.5)
WBC: 14.1 10*3/uL — ABNORMAL HIGH (ref 3.8–10.6)

## 2014-05-05 LAB — PHOSPHORUS: PHOSPHORUS: 2.9 mg/dL (ref 2.5–4.9)

## 2014-05-05 LAB — PROTIME-INR
INR: 2.2
Prothrombin Time: 23.5 secs — ABNORMAL HIGH (ref 11.5–14.7)

## 2014-05-06 LAB — BASIC METABOLIC PANEL
ANION GAP: 10 (ref 7–16)
BUN: 29 mg/dL — ABNORMAL HIGH (ref 7–18)
CHLORIDE: 106 mmol/L (ref 98–107)
CO2: 18 mmol/L — AB (ref 21–32)
Calcium, Total: 7.4 mg/dL — ABNORMAL LOW (ref 8.5–10.1)
Creatinine: 1.81 mg/dL — ABNORMAL HIGH (ref 0.60–1.30)
GFR CALC AF AMER: 38 — AB
GFR CALC NON AF AMER: 32 — AB
GLUCOSE: 110 mg/dL — AB (ref 65–99)
Osmolality: 275 (ref 275–301)
Potassium: 4.5 mmol/L (ref 3.5–5.1)
SODIUM: 134 mmol/L — AB (ref 136–145)

## 2014-05-06 LAB — URIC ACID: Uric Acid: 3.8 mg/dL (ref 3.5–7.2)

## 2014-05-06 LAB — PROTIME-INR
INR: 1.4
Prothrombin Time: 16.7 secs — ABNORMAL HIGH (ref 11.5–14.7)

## 2014-05-06 LAB — WBC: WBC: 14.3 10*3/uL — ABNORMAL HIGH (ref 3.8–10.6)

## 2014-05-06 LAB — SEDIMENTATION RATE: ERYTHROCYTE SED RATE: 82 mm/h — AB (ref 0–20)

## 2014-05-07 LAB — SYNOVIAL CELL COUNT + DIFF, W/ CRYSTALS
Basophil: 0 %
Eosinophil: 0 %
LYMPHS PCT: 1 %
NEUTROS PCT: 91 %
NUCLEATED CELL COUNT: 35712 /mm3
Other Cells BF: 0 %
Other Mononuclear Cells: 8 %

## 2014-05-07 LAB — BASIC METABOLIC PANEL
Anion Gap: 8 (ref 7–16)
BUN: 21 mg/dL — ABNORMAL HIGH (ref 7–18)
CALCIUM: 7.1 mg/dL — AB (ref 8.5–10.1)
CO2: 21 mmol/L (ref 21–32)
CREATININE: 1.28 mg/dL (ref 0.60–1.30)
Chloride: 109 mmol/L — ABNORMAL HIGH (ref 98–107)
EGFR (African American): 57 — ABNORMAL LOW
GFR CALC NON AF AMER: 49 — AB
GLUCOSE: 119 mg/dL — AB (ref 65–99)
OSMOLALITY: 280 (ref 275–301)
POTASSIUM: 3.8 mmol/L (ref 3.5–5.1)
Sodium: 138 mmol/L (ref 136–145)

## 2014-05-08 LAB — PROTIME-INR
INR: 1.3
PROTHROMBIN TIME: 15.6 s — AB (ref 11.5–14.7)

## 2014-05-08 LAB — CBC WITH DIFFERENTIAL/PLATELET
BASOS PCT: 0.2 %
Basophil #: 0 10*3/uL (ref 0.0–0.1)
Eosinophil #: 0 10*3/uL (ref 0.0–0.7)
Eosinophil %: 0.1 %
HCT: 21.2 % — ABNORMAL LOW (ref 40.0–52.0)
HGB: 6.7 g/dL — ABNORMAL LOW (ref 13.0–18.0)
Lymphocyte #: 0.6 10*3/uL — ABNORMAL LOW (ref 1.0–3.6)
Lymphocyte %: 4 %
MCH: 28.3 pg (ref 26.0–34.0)
MCHC: 31.5 g/dL — AB (ref 32.0–36.0)
MCV: 90 fL (ref 80–100)
Monocyte #: 1 x10 3/mm (ref 0.2–1.0)
Monocyte %: 6.7 %
Neutrophil #: 13.3 10*3/uL — ABNORMAL HIGH (ref 1.4–6.5)
Neutrophil %: 89 %
PLATELETS: 270 10*3/uL (ref 150–440)
RBC: 2.37 10*6/uL — AB (ref 4.40–5.90)
RDW: 15.2 % — ABNORMAL HIGH (ref 11.5–14.5)
WBC: 14.9 10*3/uL — ABNORMAL HIGH (ref 3.8–10.6)

## 2014-05-08 LAB — HEMOGLOBIN A1C: Hemoglobin A1C: 6.2 % (ref 4.2–6.3)

## 2014-05-08 LAB — IRON AND TIBC
IRON SATURATION: 24 %
IRON: 36 ug/dL — AB (ref 65–175)
Iron Bind.Cap.(Total): 150 ug/dL — ABNORMAL LOW (ref 250–450)
Unbound Iron-Bind.Cap.: 114 ug/dL

## 2014-05-08 LAB — FERRITIN: Ferritin (ARMC): 916 ng/mL — ABNORMAL HIGH (ref 8–388)

## 2014-05-08 LAB — OCCULT BLOOD X 1 CARD TO LAB, STOOL: Occult Blood, Feces: NEGATIVE

## 2014-05-09 LAB — BASIC METABOLIC PANEL
Anion Gap: 9 (ref 7–16)
BUN: 29 mg/dL — AB (ref 7–18)
Calcium, Total: 7.2 mg/dL — ABNORMAL LOW (ref 8.5–10.1)
Chloride: 110 mmol/L — ABNORMAL HIGH (ref 98–107)
Co2: 19 mmol/L — ABNORMAL LOW (ref 21–32)
Creatinine: 1.64 mg/dL — ABNORMAL HIGH (ref 0.60–1.30)
EGFR (African American): 42 — ABNORMAL LOW
GFR CALC NON AF AMER: 37 — AB
Glucose: 152 mg/dL — ABNORMAL HIGH (ref 65–99)
Osmolality: 284 (ref 275–301)
Potassium: 3.9 mmol/L (ref 3.5–5.1)
Sodium: 138 mmol/L (ref 136–145)

## 2014-05-09 LAB — CBC WITH DIFFERENTIAL/PLATELET
Basophil #: 0.1 10*3/uL (ref 0.0–0.1)
Basophil %: 0.5 %
EOS PCT: 0 %
Eosinophil #: 0 10*3/uL (ref 0.0–0.7)
HCT: 22.7 % — AB (ref 40.0–52.0)
HGB: 7.3 g/dL — AB (ref 13.0–18.0)
LYMPHS PCT: 5.3 %
Lymphocyte #: 0.8 10*3/uL — ABNORMAL LOW (ref 1.0–3.6)
MCH: 28.2 pg (ref 26.0–34.0)
MCHC: 32.2 g/dL (ref 32.0–36.0)
MCV: 88 fL (ref 80–100)
Monocyte #: 0.9 x10 3/mm (ref 0.2–1.0)
Monocyte %: 5.8 %
NEUTROS PCT: 88.4 %
Neutrophil #: 13.7 10*3/uL — ABNORMAL HIGH (ref 1.4–6.5)
Platelet: 274 10*3/uL (ref 150–440)
RBC: 2.59 10*6/uL — ABNORMAL LOW (ref 4.40–5.90)
RDW: 16.1 % — ABNORMAL HIGH (ref 11.5–14.5)
WBC: 15.6 10*3/uL — AB (ref 3.8–10.6)

## 2014-05-10 LAB — CBC WITH DIFFERENTIAL/PLATELET
BASOS PCT: 0.3 %
Basophil #: 0 10*3/uL (ref 0.0–0.1)
Eosinophil #: 0.1 10*3/uL (ref 0.0–0.7)
Eosinophil %: 1 %
HCT: 28.6 % — AB (ref 40.0–52.0)
HGB: 9.1 g/dL — ABNORMAL LOW (ref 13.0–18.0)
Lymphocyte #: 1.1 10*3/uL (ref 1.0–3.6)
Lymphocyte %: 7.6 %
MCH: 28.1 pg (ref 26.0–34.0)
MCHC: 31.9 g/dL — ABNORMAL LOW (ref 32.0–36.0)
MCV: 88 fL (ref 80–100)
MONO ABS: 1.1 x10 3/mm — AB (ref 0.2–1.0)
MONOS PCT: 7.8 %
Neutrophil #: 11.7 10*3/uL — ABNORMAL HIGH (ref 1.4–6.5)
Neutrophil %: 83.3 %
Platelet: 289 10*3/uL (ref 150–440)
RBC: 3.26 10*6/uL — ABNORMAL LOW (ref 4.40–5.90)
RDW: 15.9 % — ABNORMAL HIGH (ref 11.5–14.5)
WBC: 14.1 10*3/uL — ABNORMAL HIGH (ref 3.8–10.6)

## 2014-05-11 LAB — BODY FLUID CULTURE

## 2014-05-19 ENCOUNTER — Ambulatory Visit: Payer: Self-pay | Admitting: Vascular Surgery

## 2014-05-19 LAB — BASIC METABOLIC PANEL
Anion Gap: 9 (ref 7–16)
BUN: 18 mg/dL (ref 7–18)
CO2: 28 mmol/L (ref 21–32)
CREATININE: 1.31 mg/dL — AB (ref 0.60–1.30)
Calcium, Total: 8.5 mg/dL (ref 8.5–10.1)
Chloride: 103 mmol/L (ref 98–107)
EGFR (Non-African Amer.): 48 — ABNORMAL LOW
GFR CALC AF AMER: 56 — AB
Glucose: 124 mg/dL — ABNORMAL HIGH (ref 65–99)
Osmolality: 283 (ref 275–301)
POTASSIUM: 3 mmol/L — AB (ref 3.5–5.1)
Sodium: 140 mmol/L (ref 136–145)

## 2014-05-19 LAB — PROTIME-INR
INR: 1
Prothrombin Time: 13.2 secs (ref 11.5–14.7)

## 2014-05-24 LAB — PATHOLOGY REPORT

## 2014-06-29 ENCOUNTER — Ambulatory Visit: Payer: Commercial Managed Care - HMO | Admitting: Podiatry

## 2014-07-04 ENCOUNTER — Ambulatory Visit: Payer: Commercial Managed Care - HMO | Admitting: Podiatry

## 2014-07-04 ENCOUNTER — Ambulatory Visit: Payer: Self-pay | Admitting: Vascular Surgery

## 2014-07-04 LAB — BASIC METABOLIC PANEL
Anion Gap: 10 (ref 7–16)
BUN: 20 mg/dL — ABNORMAL HIGH (ref 7–18)
CREATININE: 1.44 mg/dL — AB (ref 0.60–1.30)
Calcium, Total: 9.3 mg/dL (ref 8.5–10.1)
Chloride: 109 mmol/L — ABNORMAL HIGH (ref 98–107)
Co2: 23 mmol/L (ref 21–32)
EGFR (Non-African Amer.): 43 — ABNORMAL LOW
GFR CALC AF AMER: 50 — AB
Glucose: 115 mg/dL — ABNORMAL HIGH (ref 65–99)
OSMOLALITY: 287 (ref 275–301)
POTASSIUM: 4.4 mmol/L (ref 3.5–5.1)
Sodium: 142 mmol/L (ref 136–145)

## 2014-07-04 LAB — PROTIME-INR
INR: 1.1
Prothrombin Time: 13.6 secs (ref 11.5–14.7)

## 2014-07-06 ENCOUNTER — Inpatient Hospital Stay: Payer: Self-pay | Admitting: Internal Medicine

## 2014-07-06 LAB — SEDIMENTATION RATE: Erythrocyte Sed Rate: 120 mm/hr — ABNORMAL HIGH (ref 0–20)

## 2014-07-06 LAB — CBC
HCT: 28 % — ABNORMAL LOW (ref 40.0–52.0)
HGB: 8.7 g/dL — ABNORMAL LOW (ref 13.0–18.0)
MCH: 27.2 pg (ref 26.0–34.0)
MCHC: 31.2 g/dL — ABNORMAL LOW (ref 32.0–36.0)
MCV: 87 fL (ref 80–100)
PLATELETS: 333 10*3/uL (ref 150–440)
RBC: 3.21 10*6/uL — ABNORMAL LOW (ref 4.40–5.90)
RDW: 17.5 % — ABNORMAL HIGH (ref 11.5–14.5)
WBC: 9.6 10*3/uL (ref 3.8–10.6)

## 2014-07-06 LAB — BASIC METABOLIC PANEL
ANION GAP: 9 (ref 7–16)
BUN: 22 mg/dL — AB (ref 7–18)
CALCIUM: 8.8 mg/dL (ref 8.5–10.1)
Chloride: 106 mmol/L (ref 98–107)
Co2: 23 mmol/L (ref 21–32)
Creatinine: 1.42 mg/dL — ABNORMAL HIGH (ref 0.60–1.30)
GFR CALC AF AMER: 50 — AB
GFR CALC NON AF AMER: 43 — AB
Glucose: 126 mg/dL — ABNORMAL HIGH (ref 65–99)
OSMOLALITY: 281 (ref 275–301)
POTASSIUM: 5.2 mmol/L — AB (ref 3.5–5.1)
Sodium: 138 mmol/L (ref 136–145)

## 2014-07-06 LAB — PROTIME-INR
INR: 1.1
PROTHROMBIN TIME: 13.9 s (ref 11.5–14.7)

## 2014-07-07 LAB — CBC WITH DIFFERENTIAL/PLATELET
BASOS PCT: 0.8 %
Basophil #: 0.1 10*3/uL (ref 0.0–0.1)
EOS ABS: 0.1 10*3/uL (ref 0.0–0.7)
Eosinophil %: 2 %
HCT: 26.1 % — AB (ref 40.0–52.0)
HGB: 8.3 g/dL — ABNORMAL LOW (ref 13.0–18.0)
LYMPHS ABS: 1.4 10*3/uL (ref 1.0–3.6)
Lymphocyte %: 19.5 %
MCH: 27.7 pg (ref 26.0–34.0)
MCHC: 31.9 g/dL — ABNORMAL LOW (ref 32.0–36.0)
MCV: 87 fL (ref 80–100)
MONO ABS: 0.9 x10 3/mm (ref 0.2–1.0)
MONOS PCT: 11.9 %
NEUTROS ABS: 4.7 10*3/uL (ref 1.4–6.5)
Neutrophil %: 65.8 %
Platelet: 310 10*3/uL (ref 150–440)
RBC: 3 10*6/uL — AB (ref 4.40–5.90)
RDW: 16.7 % — ABNORMAL HIGH (ref 11.5–14.5)
WBC: 7.2 10*3/uL (ref 3.8–10.6)

## 2014-07-07 LAB — BASIC METABOLIC PANEL
ANION GAP: 10 (ref 7–16)
BUN: 19 mg/dL — ABNORMAL HIGH (ref 7–18)
CHLORIDE: 107 mmol/L (ref 98–107)
Calcium, Total: 8.8 mg/dL (ref 8.5–10.1)
Co2: 23 mmol/L (ref 21–32)
Creatinine: 1.42 mg/dL — ABNORMAL HIGH (ref 0.60–1.30)
GFR CALC AF AMER: 50 — AB
GFR CALC NON AF AMER: 43 — AB
Glucose: 98 mg/dL (ref 65–99)
OSMOLALITY: 282 (ref 275–301)
POTASSIUM: 3.8 mmol/L (ref 3.5–5.1)
Sodium: 140 mmol/L (ref 136–145)

## 2014-07-08 LAB — PROTIME-INR
INR: 1.1
PROTHROMBIN TIME: 13.8 s (ref 11.5–14.7)

## 2014-07-11 LAB — CULTURE, BLOOD (SINGLE)

## 2014-07-13 ENCOUNTER — Ambulatory Visit: Payer: Self-pay | Admitting: Vascular Surgery

## 2014-07-13 ENCOUNTER — Ambulatory Visit: Payer: Commercial Managed Care - HMO | Admitting: Podiatry

## 2014-07-13 LAB — PROTIME-INR
INR: 1.7
Prothrombin Time: 19.2 secs — ABNORMAL HIGH (ref 11.5–14.7)

## 2014-07-14 LAB — MISC AER/ANAEROBIC CULT.

## 2014-07-15 LAB — WOUND CULTURE

## 2014-07-16 LAB — PATHOLOGY REPORT

## 2014-07-25 ENCOUNTER — Encounter: Payer: Self-pay | Admitting: Surgery

## 2014-08-16 ENCOUNTER — Ambulatory Visit: Payer: Self-pay | Admitting: Internal Medicine

## 2014-08-16 LAB — BASIC METABOLIC PANEL
Anion Gap: 11 (ref 7–16)
BUN: 33 mg/dL — AB (ref 7–18)
CHLORIDE: 101 mmol/L (ref 98–107)
Calcium, Total: 9.3 mg/dL (ref 8.5–10.1)
Co2: 22 mmol/L (ref 21–32)
Creatinine: 1.6 mg/dL — ABNORMAL HIGH (ref 0.60–1.30)
EGFR (African American): 53 — ABNORMAL LOW
EGFR (Non-African Amer.): 43 — ABNORMAL LOW
Glucose: 138 mg/dL — ABNORMAL HIGH (ref 65–99)
Osmolality: 278 (ref 275–301)
POTASSIUM: 4.8 mmol/L (ref 3.5–5.1)
SODIUM: 134 mmol/L — AB (ref 136–145)

## 2014-08-16 LAB — CBC CANCER CENTER
BASOS PCT: 1.2 %
Basophil #: 0.1 x10 3/mm (ref 0.0–0.1)
EOS ABS: 0.1 x10 3/mm (ref 0.0–0.7)
EOS PCT: 0.6 %
HCT: 31.1 % — AB (ref 40.0–52.0)
HGB: 9.9 g/dL — AB (ref 13.0–18.0)
LYMPHS ABS: 1.5 x10 3/mm (ref 1.0–3.6)
Lymphocyte %: 15.4 %
MCH: 27.7 pg (ref 26.0–34.0)
MCHC: 31.8 g/dL — ABNORMAL LOW (ref 32.0–36.0)
MCV: 87 fL (ref 80–100)
Monocyte #: 0.8 x10 3/mm (ref 0.2–1.0)
Monocyte %: 8 %
Neutrophil #: 7.1 x10 3/mm — ABNORMAL HIGH (ref 1.4–6.5)
Neutrophil %: 74.8 %
PLATELETS: 396 x10 3/mm (ref 150–440)
RBC: 3.57 10*6/uL — AB (ref 4.40–5.90)
RDW: 19.6 % — ABNORMAL HIGH (ref 11.5–14.5)
WBC: 9.5 x10 3/mm (ref 3.8–10.6)

## 2014-08-17 LAB — PSA: PSA: 2.4 ng/mL

## 2014-09-11 ENCOUNTER — Ambulatory Visit: Payer: Self-pay | Admitting: Internal Medicine

## 2014-09-17 ENCOUNTER — Emergency Department: Payer: Self-pay | Admitting: Emergency Medicine

## 2014-09-17 LAB — COMPREHENSIVE METABOLIC PANEL
Albumin: 2.9 g/dL — ABNORMAL LOW (ref 3.4–5.0)
Alkaline Phosphatase: 63 U/L
Anion Gap: 10 (ref 7–16)
BUN: 32 mg/dL — ABNORMAL HIGH (ref 7–18)
Bilirubin,Total: 0.3 mg/dL (ref 0.2–1.0)
CO2: 24 mmol/L (ref 21–32)
Calcium, Total: 9.1 mg/dL (ref 8.5–10.1)
Chloride: 102 mmol/L (ref 98–107)
Creatinine: 1.54 mg/dL — ABNORMAL HIGH (ref 0.60–1.30)
EGFR (African American): 55 — ABNORMAL LOW
EGFR (Non-African Amer.): 45 — ABNORMAL LOW
GLUCOSE: 171 mg/dL — AB (ref 65–99)
OSMOLALITY: 283 (ref 275–301)
Potassium: 5.1 mmol/L (ref 3.5–5.1)
SGOT(AST): 24 U/L (ref 15–37)
SGPT (ALT): 16 U/L
Sodium: 136 mmol/L (ref 136–145)
Total Protein: 8.4 g/dL — ABNORMAL HIGH (ref 6.4–8.2)

## 2014-09-17 LAB — URINALYSIS, COMPLETE
BLOOD: NEGATIVE
Bilirubin,UR: NEGATIVE
GLUCOSE, UR: NEGATIVE mg/dL (ref 0–75)
Ketone: NEGATIVE
NITRITE: NEGATIVE
Ph: 5 (ref 4.5–8.0)
RBC,UR: 3 /HPF (ref 0–5)
Specific Gravity: 1.016 (ref 1.003–1.030)
Squamous Epithelial: 1
WBC UR: 27 /HPF (ref 0–5)

## 2014-09-17 LAB — CBC
HCT: 30.7 % — ABNORMAL LOW (ref 40.0–52.0)
HGB: 9.8 g/dL — ABNORMAL LOW (ref 13.0–18.0)
MCH: 27.6 pg (ref 26.0–34.0)
MCHC: 31.8 g/dL — ABNORMAL LOW (ref 32.0–36.0)
MCV: 87 fL (ref 80–100)
PLATELETS: 545 10*3/uL — AB (ref 150–440)
RBC: 3.55 10*6/uL — AB (ref 4.40–5.90)
RDW: 17.3 % — ABNORMAL HIGH (ref 11.5–14.5)
WBC: 14.7 10*3/uL — AB (ref 3.8–10.6)

## 2014-09-17 LAB — PROTIME-INR
INR: 7.4
Prothrombin Time: 60.7 secs — ABNORMAL HIGH (ref 11.5–14.7)

## 2014-09-17 LAB — TROPONIN I: Troponin-I: <0.02 ng/mL

## 2014-09-17 LAB — APTT: ACTIVATED PTT: 62.3 s — AB (ref 23.6–35.9)

## 2014-09-17 LAB — LIPASE, BLOOD: Lipase: 47 U/L — ABNORMAL LOW (ref 73–393)

## 2014-10-03 ENCOUNTER — Inpatient Hospital Stay (HOSPITAL_COMMUNITY)
Admission: AD | Admit: 2014-10-03 | Discharge: 2014-10-10 | DRG: 082 | Disposition: A | Discharge status: Skilled Nursing Facility | Payer: Medicare HMO | Source: Other Acute Inpatient Hospital | Attending: Internal Medicine | Admitting: Internal Medicine

## 2014-10-03 ENCOUNTER — Encounter (HOSPITAL_COMMUNITY): Payer: Self-pay | Admitting: Internal Medicine

## 2014-10-03 ENCOUNTER — Emergency Department: Payer: Self-pay | Admitting: Emergency Medicine

## 2014-10-03 DIAGNOSIS — E43 Unspecified severe protein-calorie malnutrition: Secondary | ICD-10-CM | POA: Insufficient documentation

## 2014-10-03 DIAGNOSIS — G8191 Hemiplegia, unspecified affecting right dominant side: Secondary | ICD-10-CM | POA: Diagnosis present

## 2014-10-03 DIAGNOSIS — C61 Malignant neoplasm of prostate: Secondary | ICD-10-CM | POA: Diagnosis present

## 2014-10-03 DIAGNOSIS — I6203 Nontraumatic chronic subdural hemorrhage: Secondary | ICD-10-CM

## 2014-10-03 DIAGNOSIS — S065XAA Traumatic subdural hemorrhage with loss of consciousness status unknown, initial encounter: Secondary | ICD-10-CM | POA: Diagnosis present

## 2014-10-03 DIAGNOSIS — E119 Type 2 diabetes mellitus without complications: Secondary | ICD-10-CM | POA: Diagnosis present

## 2014-10-03 DIAGNOSIS — I82409 Acute embolism and thrombosis of unspecified deep veins of unspecified lower extremity: Secondary | ICD-10-CM | POA: Insufficient documentation

## 2014-10-03 DIAGNOSIS — Z6822 Body mass index (BMI) 22.0-22.9, adult: Secondary | ICD-10-CM

## 2014-10-03 DIAGNOSIS — L8993 Pressure ulcer of unspecified site, stage 3: Secondary | ICD-10-CM | POA: Diagnosis present

## 2014-10-03 DIAGNOSIS — A0472 Enterocolitis due to Clostridium difficile, not specified as recurrent: Secondary | ICD-10-CM

## 2014-10-03 DIAGNOSIS — C7951 Secondary malignant neoplasm of bone: Secondary | ICD-10-CM | POA: Diagnosis present

## 2014-10-03 DIAGNOSIS — E785 Hyperlipidemia, unspecified: Secondary | ICD-10-CM | POA: Diagnosis present

## 2014-10-03 DIAGNOSIS — L97519 Non-pressure chronic ulcer of other part of right foot with unspecified severity: Secondary | ICD-10-CM | POA: Diagnosis present

## 2014-10-03 DIAGNOSIS — Z8546 Personal history of malignant neoplasm of prostate: Secondary | ICD-10-CM

## 2014-10-03 DIAGNOSIS — E042 Nontoxic multinodular goiter: Secondary | ICD-10-CM | POA: Diagnosis present

## 2014-10-03 DIAGNOSIS — L89610 Pressure ulcer of right heel, unstageable: Secondary | ICD-10-CM | POA: Diagnosis present

## 2014-10-03 DIAGNOSIS — D638 Anemia in other chronic diseases classified elsewhere: Secondary | ICD-10-CM | POA: Diagnosis present

## 2014-10-03 DIAGNOSIS — W19XXXA Unspecified fall, initial encounter: Secondary | ICD-10-CM | POA: Diagnosis present

## 2014-10-03 DIAGNOSIS — R1310 Dysphagia, unspecified: Secondary | ICD-10-CM | POA: Diagnosis present

## 2014-10-03 DIAGNOSIS — I10 Essential (primary) hypertension: Secondary | ICD-10-CM | POA: Diagnosis present

## 2014-10-03 DIAGNOSIS — I6201 Nontraumatic acute subdural hemorrhage: Secondary | ICD-10-CM

## 2014-10-03 DIAGNOSIS — R918 Other nonspecific abnormal finding of lung field: Secondary | ICD-10-CM | POA: Diagnosis present

## 2014-10-03 DIAGNOSIS — R410 Disorientation, unspecified: Secondary | ICD-10-CM | POA: Diagnosis present

## 2014-10-03 DIAGNOSIS — Z23 Encounter for immunization: Secondary | ICD-10-CM

## 2014-10-03 DIAGNOSIS — F0390 Unspecified dementia without behavioral disturbance: Secondary | ICD-10-CM | POA: Diagnosis present

## 2014-10-03 DIAGNOSIS — E0781 Sick-euthyroid syndrome: Secondary | ICD-10-CM | POA: Diagnosis present

## 2014-10-03 DIAGNOSIS — D649 Anemia, unspecified: Secondary | ICD-10-CM | POA: Diagnosis present

## 2014-10-03 DIAGNOSIS — Z86718 Personal history of other venous thrombosis and embolism: Secondary | ICD-10-CM | POA: Diagnosis not present

## 2014-10-03 DIAGNOSIS — S065X9A Traumatic subdural hemorrhage with loss of consciousness of unspecified duration, initial encounter: Secondary | ICD-10-CM | POA: Diagnosis present

## 2014-10-03 DIAGNOSIS — T8189XA Other complications of procedures, not elsewhere classified, initial encounter: Secondary | ICD-10-CM | POA: Diagnosis present

## 2014-10-03 DIAGNOSIS — L89623 Pressure ulcer of left heel, stage 3: Secondary | ICD-10-CM | POA: Diagnosis present

## 2014-10-03 DIAGNOSIS — A047 Enterocolitis due to Clostridium difficile: Secondary | ICD-10-CM | POA: Diagnosis present

## 2014-10-03 DIAGNOSIS — I6200 Nontraumatic subdural hemorrhage, unspecified: Secondary | ICD-10-CM

## 2014-10-03 DIAGNOSIS — Z7901 Long term (current) use of anticoagulants: Secondary | ICD-10-CM | POA: Diagnosis not present

## 2014-10-03 DIAGNOSIS — N390 Urinary tract infection, site not specified: Secondary | ICD-10-CM | POA: Diagnosis not present

## 2014-10-03 DIAGNOSIS — L97529 Non-pressure chronic ulcer of other part of left foot with unspecified severity: Secondary | ICD-10-CM

## 2014-10-03 LAB — CBC
HCT: 22.3 % — ABNORMAL LOW (ref 40.0–52.0)
HGB: 7.1 g/dL — ABNORMAL LOW (ref 13.0–18.0)
MCH: 28.3 pg (ref 26.0–34.0)
MCHC: 32.1 g/dL (ref 32.0–36.0)
MCV: 88 fL (ref 80–100)
Platelet: 384 10*3/uL (ref 150–440)
RBC: 2.52 10*6/uL — AB (ref 4.40–5.90)
RDW: 17.9 % — AB (ref 11.5–14.5)
WBC: 8.8 10*3/uL (ref 3.8–10.6)

## 2014-10-03 LAB — BASIC METABOLIC PANEL
Anion Gap: 8 (ref 7–16)
BUN: 22 mg/dL — AB (ref 7–18)
CHLORIDE: 104 mmol/L (ref 98–107)
CREATININE: 1.26 mg/dL (ref 0.60–1.30)
Calcium, Total: 7.8 mg/dL — ABNORMAL LOW (ref 8.5–10.1)
Co2: 25 mmol/L (ref 21–32)
EGFR (African American): >60
GFR CALC NON AF AMER: 57 — AB
GLUCOSE: 171 mg/dL — AB (ref 65–99)
OSMOLALITY: 281 (ref 275–301)
Potassium: 4.1 mmol/L (ref 3.5–5.1)
Sodium: 137 mmol/L (ref 136–145)

## 2014-10-03 LAB — URINALYSIS, COMPLETE
BILIRUBIN, UR: NEGATIVE
Glucose,UR: 50 mg/dL (ref 0–75)
KETONE: NEGATIVE
NITRITE: POSITIVE
PH: 5 (ref 4.5–8.0)
Protein: 100
Specific Gravity: 1.014 (ref 1.003–1.030)
Squamous Epithelial: 4
WBC UR: 1261 /HPF (ref 0–5)

## 2014-10-03 LAB — GLUCOSE, CAPILLARY: Glucose-Capillary: 144 mg/dL — ABNORMAL HIGH (ref 70–99)

## 2014-10-03 LAB — TROPONIN I: Troponin-I: <0.02 ng/mL

## 2014-10-03 LAB — PROTIME-INR
INR: 1.1
Prothrombin Time: 14.1 secs (ref 11.5–14.7)

## 2014-10-03 LAB — TYPE AND SCREEN
ABO/RH(D): A POS
ANTIBODY SCREEN: NEGATIVE

## 2014-10-03 MED ORDER — ONDANSETRON HCL 4 MG PO TABS: 4.0000 mg | ORAL_TABLET | Freq: Four times a day (QID) | ORAL | Status: DC | PRN

## 2014-10-03 MED ORDER — ONDANSETRON HCL 4 MG/2ML IJ SOLN: 4.0000 mg | Freq: Four times a day (QID) | INTRAMUSCULAR | Status: DC | PRN

## 2014-10-03 MED ORDER — MORPHINE SULFATE 2 MG/ML IJ SOLN
0.5000 mg | INTRAMUSCULAR | Status: DC | PRN
Start: 2014-10-03 — End: 2014-10-07

## 2014-10-03 MED ORDER — ACETAMINOPHEN 325 MG PO TABS: 650.0000 mg | ORAL_TABLET | Freq: Four times a day (QID) | ORAL | Status: DC | PRN

## 2014-10-03 MED ORDER — BRIMONIDINE TARTRATE 0.2 % OP SOLN
1.0000 [drp] | Freq: Every day | OPHTHALMIC | Status: DC
  Administered 2014-10-04 – 2014-10-07 (×4): 1 [drp] via OPHTHALMIC
  Filled 2014-10-03: qty 5

## 2014-10-03 MED ORDER — CHLORHEXIDINE GLUCONATE 0.12 % MT SOLN
15.0000 mL | Freq: Two times a day (BID) | OROMUCOSAL | Status: DC
  Administered 2014-10-04 – 2014-10-09 (×13): 15 mL via OROMUCOSAL
  Filled 2014-10-03 (×14): qty 15

## 2014-10-03 MED ORDER — ACETAMINOPHEN 650 MG RE SUPP: 650.0000 mg | Freq: Four times a day (QID) | RECTAL | Status: DC | PRN

## 2014-10-03 MED ORDER — TIMOLOL MALEATE 0.5 % OP SOLN
1.0000 [drp] | Freq: Every day | OPHTHALMIC | Status: DC
  Administered 2014-10-04 – 2014-10-09 (×6): 1 [drp] via OPHTHALMIC
  Filled 2014-10-03: qty 5

## 2014-10-03 MED ORDER — HYDRALAZINE HCL 20 MG/ML IJ SOLN: 10.0000 mg | INTRAMUSCULAR | Status: DC | PRN

## 2014-10-03 MED ORDER — CETYLPYRIDINIUM CHLORIDE 0.05 % MT LIQD
7.0000 mL | Freq: Two times a day (BID) | OROMUCOSAL | Status: DC
Start: 2014-10-04 — End: 2014-10-10
  Administered 2014-10-04 – 2014-10-10 (×11): 7 mL via OROMUCOSAL

## 2014-10-03 MED ORDER — SODIUM CHLORIDE 0.9 % IV SOLN
INTRAVENOUS | Status: AC
  Administered 2014-10-03: via INTRAVENOUS

## 2014-10-03 MED ORDER — TRAVOPROST (BAK FREE) 0.004 % OP SOLN
1.0000 [drp] | Freq: Every day | OPHTHALMIC | Status: DC
  Administered 2014-10-03 – 2014-10-09 (×7): 1 [drp] via OPHTHALMIC
  Filled 2014-10-03 (×2): qty 2.5

## 2014-10-03 MED ORDER — INSULIN ASPART 100 UNIT/ML ~~LOC~~ SOLN
0.0000 [IU] | SUBCUTANEOUS | Status: DC
  Administered 2014-10-03 – 2014-10-05 (×5): 1 [IU] via SUBCUTANEOUS

## 2014-10-03 NOTE — H&P (Addendum)
[facility] History and Physical  Jacob Hodges ALP:[id number] DOB: [birth year] DOA: 10/03/2014  Referring physician: Patient was transferred from [facility]. PCP: Debbora Dus, MD   Chief Complaint: Anemia. Confusion.  History obtained from patient's niece.  HPI: Jacob Hodges is a 80 y.o. male who was recently admitted to [facility] after patient had a traumatic subdural hematoma and was managed conservatively. During the course patient also was found to have C. difficile colitis and also had undergone IVC filter placement has history of DVT. Patient also was found to have multiple lung nodules for which further workup was planned as outpatient per oncologist. Patient was eventually discharged to nursing home following which patient was found to have increasing confusion and blood work showed worsening anemia. Patient was transferred to [facility]. Hemoglobin was found to be around 6.7. Since patient was found to be confused CT head was done which showed worsening of patient's known left subdural hematoma increasing size from 8 mm to 18 mm with midline shift. On-call neurosurgeon at [facility] by the ER physician as patient's family wanted lesion to be transferred to [facility]. Patient also had received 1 unit PRBC in the ER for the anemia. On exam patient appears confused and is oriented to his name only. Patient has known history of being weak on the right side.   Labs done at [facility] center showed sodium of 134 potassium 4.2 chloride of 103 bicarbonate of 53 BUN of 20 creatinine 1.1 glucose of 142 calcium of 8.1 hemoglobin of 6.7 platelets around 285.  Review of Systems: As presented in the history of presenting illness, rest negative.  Past Medical History  Diagnosis Date  . Bilateral swelling of feet   . Diabetes   . Hypertension    Past Surgical History  Procedure Laterality Date  . Prostate cancer     Social  History:  reports that he has never smoked. He has never used smokeless tobacco. He reports that he does not drink alcohol or use illicit drugs. Where does patient live presently lives in nursing home. Can patient participate in ADLs? Not sure.  No Known Allergies  Family History: History reviewed. No pertinent family history.    Prior to Admission medications   Medication Sig Start Date End Date Taking? Authorizing Provider  amLODipine (NORVASC) 10 MG tablet Take 10 mg by mouth daily.    Historical Provider, MD  atenolol (TENORMIN) 25 MG tablet Take 25 mg by mouth daily.    Historical Provider, MD  brimonidine (ALPHAGAN) 0.15 % ophthalmic solution Place 1 drop into both eyes daily.    Historical Provider, MD  ciprofloxacin (CIPRO) 500 MG tablet Take 1 tablet (500 mg total) by mouth 2 (two) times daily. 04/26/14   Wallene Huh, DPM  doxycycline (VIBRA-TABS) 100 MG tablet Take 1 tablet (100 mg total) by mouth 2 (two) times daily. 04/26/14   Wallene Huh, DPM  enalapril (VASOTEC) 5 MG tablet Take 5 mg by mouth daily.    Historical Provider, MD  oxyCODONE-acetaminophen (PERCOCET) 10-325 MG per tablet Take 1 tablet by mouth every 8 (eight) hours as needed for pain. 04/26/14   Wallene Huh, DPM  pravastatin (PRAVACHOL) 20 MG tablet Take 20 mg by mouth daily.    Historical Provider, MD  timolol (BETIMOL) 0.5 % ophthalmic solution Place 1 drop into both eyes daily.    Historical Provider, MD  travoprost, benzalkonium, (TRAVATAN) 0.004 % ophthalmic solution Place 1  drop into both eyes at bedtime.    Historical Provider, MD  Vitamin D, Ergocalciferol, (DRISDOL) 50000 UNITS CAPS capsule Take 50,000 Units by mouth every 30 (thirty) days.    Historical Provider, MD  warfarin (COUMADIN) 6 MG tablet Take 6 mg by mouth daily.    Historical Provider, MD    Physical Exam: Filed Vitals:   10/03/14 2049  BP: 155/99  Pulse: 55  Temp: 99.9 F (37.7 C)  TempSrc: Oral  Resp: 17  Height: 6' (1.829 m)   Weight: 74.2 kg (163 lb 9.3 oz)  SpO2: 97%     General:  Well-built and nourished.  Eyes: Anicteric. No pallor.  ENT: No discharge from the ears eyes nose mouth.  Neck: No neck rigidity.  Cardiovascular: S1 and S2 heard.  Respiratory: No rhonchi or crepitations.  Abdomen: Soft nontender bowel sounds present.  Skin: No rash.  Musculoskeletal: No edema. Has bilateral foot ulcers which is dressed at this time.  Psychiatric: Patient appears confused.  Neurologic: Patient appears confused and has weakness on the right upper and lower extremities.  Labs on Admission:  Basic Metabolic Panel: No results for input(s): NA, K, CL, CO2, GLUCOSE, BUN, CREATININE, CALCIUM, MG, PHOS in the last 168 hours. Liver Function Tests: No results for input(s): AST, ALT, ALKPHOS, BILITOT, PROT, ALBUMIN in the last 168 hours. No results for input(s): LIPASE, AMYLASE in the last 168 hours. No results for input(s): AMMONIA in the last 168 hours. CBC: No results for input(s): WBC, NEUTROABS, HGB, HCT, MCV, PLT in the last 168 hours. Cardiac Enzymes: No results for input(s): CKTOTAL, CKMB, CKMBINDEX, TROPONINI in the last 168 hours.  BNP (last 3 results) No results for input(s): PROBNP in the last 8760 hours. CBG: No results for input(s): GLUCAP in the last 168 hours.  Radiological Exams on Admission: No results found.   Assessment/Plan Principal Problem:   Subdural hematoma Active Problems:   Severe anemia   Essential hypertension   Prostate cancer   History of DVT of lower extremity   Hyperlipidemia   1. Subdural hematoma acute on chronic with further worsening - I did discuss with on-call neurosurgeon Dr. Kathyrn Sheriff who will be seeing patient in consult. Further recommendations per neurosurgeon. 2. Severe anemia - patient did receive 1 unit of packed red blood cell transfusion in the [place] [facility] ER. Recheck CBC. If the patient's recent labs at [facility] patient does have  anemia at that time also. 3. Diabetes mellitus type 2 - since patient has been placed nothing by mouth at this time I have placed patient on sliding scale coverage. 4. Hypertension - when necessary IV hydralazine for now for systolic blood pressure more than 160 as patient is nothing by mouth. 5. History of DVT status post IVC filter placement recently. 6. History of prostate cancer. 7. Patient has possible metastatic lesions as per the recent workup done at [facility] for which patient was referred to oncology as outpatient. 8. Bilateral lower extremity foot ulcers - wanting consulted. Patient was recently placed on antibiotics. 9. Recently has had C. difficile colitis for which patient has finished a course of Flagyl as per patient's family.  Patient's labs are all pending including metabolic panel CBC and chest x-ray.  Code Status: Full code.  Family Communication: Patient's niece thru the phone.  Disposition Plan: Admit to inpatient.    Karalina Tift N. [facility] Pager [phone].  If 7PM-7AM, please contact night-coverage [email or web address] Password [facility] 10/03/2014, 10:18 PM

## 2014-10-04 ENCOUNTER — Inpatient Hospital Stay (HOSPITAL_COMMUNITY): Payer: Medicare HMO

## 2014-10-04 DIAGNOSIS — C7951 Secondary malignant neoplasm of bone: Secondary | ICD-10-CM

## 2014-10-04 DIAGNOSIS — I6201 Nontraumatic acute subdural hemorrhage: Secondary | ICD-10-CM

## 2014-10-04 DIAGNOSIS — L97529 Non-pressure chronic ulcer of other part of left foot with unspecified severity: Secondary | ICD-10-CM

## 2014-10-04 DIAGNOSIS — I6203 Nontraumatic chronic subdural hemorrhage: Secondary | ICD-10-CM

## 2014-10-04 DIAGNOSIS — I82409 Acute embolism and thrombosis of unspecified deep veins of unspecified lower extremity: Secondary | ICD-10-CM

## 2014-10-04 DIAGNOSIS — L97519 Non-pressure chronic ulcer of other part of right foot with unspecified severity: Secondary | ICD-10-CM

## 2014-10-04 DIAGNOSIS — C61 Malignant neoplasm of prostate: Secondary | ICD-10-CM

## 2014-10-04 LAB — COMPREHENSIVE METABOLIC PANEL
ALBUMIN: 2.3 g/dL — AB (ref 3.5–5.2)
ALT: 8 U/L (ref 0–53)
ANION GAP: 15 (ref 5–15)
AST: 20 U/L (ref 0–37)
Alkaline Phosphatase: 54 U/L (ref 39–117)
BILIRUBIN TOTAL: 1.3 mg/dL — AB (ref 0.3–1.2)
BUN: 21 mg/dL (ref 6–23)
CO2: 21 meq/L (ref 19–32)
CREATININE: 1 mg/dL (ref 0.50–1.35)
Calcium: 8.5 mg/dL (ref 8.4–10.5)
Chloride: 98 mEq/L (ref 96–112)
GFR calc Af Amer: 75 mL/min — ABNORMAL LOW (ref >90)
GFR, EST NON AFRICAN AMERICAN: 64 mL/min — AB (ref >90)
Glucose, Bld: 140 mg/dL — ABNORMAL HIGH (ref 70–99)
Potassium: 4 mEq/L (ref 3.7–5.3)
Sodium: 134 mEq/L — ABNORMAL LOW (ref 137–147)
Total Protein: 6.4 g/dL (ref 6.0–8.3)

## 2014-10-04 LAB — CBC
HEMATOCRIT: 26.9 % — AB (ref 39.0–52.0)
Hemoglobin: 8.6 g/dL — ABNORMAL LOW (ref 13.0–17.0)
MCH: 27 pg (ref 26.0–34.0)
MCHC: 32 g/dL (ref 30.0–36.0)
MCV: 84.6 fL (ref 78.0–100.0)
Platelets: 379 10*3/uL (ref 150–400)
RBC: 3.18 MIL/uL — ABNORMAL LOW (ref 4.22–5.81)
RDW: 16.2 % — AB (ref 11.5–15.5)
WBC: 8 10*3/uL (ref 4.0–10.5)

## 2014-10-04 LAB — BASIC METABOLIC PANEL
Anion gap: 15 (ref 5–15)
BUN: 20 mg/dL (ref 6–23)
CALCIUM: 8.7 mg/dL (ref 8.4–10.5)
CO2: 19 meq/L (ref 19–32)
CREATININE: 1.03 mg/dL (ref 0.50–1.35)
Chloride: 101 mEq/L (ref 96–112)
GFR calc non Af Amer: 62 mL/min — ABNORMAL LOW (ref >90)
GFR, EST AFRICAN AMERICAN: 72 mL/min — AB (ref >90)
Glucose, Bld: 123 mg/dL — ABNORMAL HIGH (ref 70–99)
Potassium: 4.2 mEq/L (ref 3.7–5.3)
Sodium: 135 mEq/L — ABNORMAL LOW (ref 137–147)

## 2014-10-04 LAB — TSH: TSH: 5.7 u[IU]/mL — AB (ref 0.350–4.500)

## 2014-10-04 LAB — MRSA PCR SCREENING: MRSA by PCR: POSITIVE — AB

## 2014-10-04 LAB — CBC WITH DIFFERENTIAL/PLATELET
BASOS ABS: 0 10*3/uL (ref 0.0–0.1)
Basophils Relative: 0 % (ref 0–1)
Eosinophils Absolute: 0.1 10*3/uL (ref 0.0–0.7)
Eosinophils Relative: 1 % (ref 0–5)
HCT: 26.9 % — ABNORMAL LOW (ref 39.0–52.0)
Hemoglobin: 8.7 g/dL — ABNORMAL LOW (ref 13.0–17.0)
Lymphocytes Relative: 13 % (ref 12–46)
Lymphs Abs: 1 10*3/uL (ref 0.7–4.0)
MCH: 28.2 pg (ref 26.0–34.0)
MCHC: 32.3 g/dL (ref 30.0–36.0)
MCV: 87.1 fL (ref 78.0–100.0)
Monocytes Absolute: 0.8 10*3/uL (ref 0.1–1.0)
Monocytes Relative: 10 % (ref 3–12)
NEUTROS ABS: 6.1 10*3/uL (ref 1.7–7.7)
Neutrophils Relative %: 76 % (ref 43–77)
PLATELETS: 386 10*3/uL (ref 150–400)
RBC: 3.09 MIL/uL — ABNORMAL LOW (ref 4.22–5.81)
RDW: 16 % — ABNORMAL HIGH (ref 11.5–15.5)
WBC: 8 10*3/uL (ref 4.0–10.5)

## 2014-10-04 LAB — HEMOGLOBIN A1C
Hgb A1c MFr Bld: 6.1 % — ABNORMAL HIGH (ref <5.7)
MEAN PLASMA GLUCOSE: 128 mg/dL — AB (ref <117)

## 2014-10-04 LAB — GLUCOSE, CAPILLARY
GLUCOSE-CAPILLARY: 130 mg/dL — AB (ref 70–99)
Glucose-Capillary: 117 mg/dL — ABNORMAL HIGH (ref 70–99)
Glucose-Capillary: 147 mg/dL — ABNORMAL HIGH (ref 70–99)
Glucose-Capillary: 108 mg/dL — ABNORMAL HIGH (ref 70–99)
Glucose-Capillary: 115 mg/dL — ABNORMAL HIGH (ref 70–99)

## 2014-10-04 LAB — ABO/RH: ABO/RH(D): A POS

## 2014-10-04 MED ORDER — ENSURE COMPLETE PO LIQD
237.0000 mL | Freq: Three times a day (TID) | ORAL | Status: DC
  Administered 2014-10-05 – 2014-10-09 (×12): 237 mL via ORAL

## 2014-10-04 MED ORDER — INFLUENZA VAC SPLIT QUAD 0.5 ML IM SUSY
0.5000 mL | PREFILLED_SYRINGE | INTRAMUSCULAR | Status: AC
  Administered 2014-10-07: 0.5 mL via INTRAMUSCULAR
  Filled 2014-10-04: qty 0.5

## 2014-10-04 MED ORDER — PNEUMOCOCCAL VAC POLYVALENT 25 MCG/0.5ML IJ INJ
0.5000 mL | INJECTION | INTRAMUSCULAR | Status: AC
  Administered 2014-10-07: 0.5 mL via INTRAMUSCULAR
  Filled 2014-10-04 (×2): qty 0.5

## 2014-10-04 MED ORDER — HYDRALAZINE HCL 20 MG/ML IJ SOLN
5.0000 mg | INTRAMUSCULAR | Status: DC
  Administered 2014-10-04 – 2014-10-07 (×15): 5 mg via INTRAVENOUS
  Filled 2014-10-04: qty 1
  Filled 2014-10-04 (×14): qty 0.25
  Filled 2014-10-04: qty 1
  Filled 2014-10-04 (×4): qty 0.25
  Filled 2014-10-04 (×2): qty 1

## 2014-10-04 NOTE — Plan of Care (Signed)
Problem: Phase I Progression Outcomes Goal: Pain controlled with appropriate interventions Outcome: Progressing Goal: OOB as tolerated unless otherwise ordered Outcome: Not Progressing

## 2014-10-04 NOTE — Progress Notes (Signed)
[facility] Care Management following on behalf of patient having [employer] insurance. Patient has been to several different facilities recently. It was asked for goals of care meeting while he was at [facility]. Patient and family could benefit from a goals of care meeting since there has not one been done thus far. Discussed with Dr Sherral Hammers and inpatient RNCM. Will continue to follow. Marthenia Rolling, MSN- RN, [facility] [phone]

## 2014-10-04 NOTE — Progress Notes (Signed)
INITIAL NUTRITION ASSESSMENT  DOCUMENTATION CODES Per approved criteria  -Severe malnutrition in the context of chronic illness   Pt meets criteria for severe MALNUTRITION in the context of chronic illness as evidenced by severe depletion of muscle mass and 8% weight loss over the past month.  INTERVENTION:  Ensure Complete PO TID, each supplement provides 350 kcal and 13 grams of protein  NUTRITION DIAGNOSIS: Malnutrition related to inadequate oral intake with progressive dementia as evidenced by severe depletion of muscle mass and 8% weight loss over the past month.   Goal: Intake to meet >90% of estimated nutrition needs.  Monitor:  Diet advancement, PO intake, labs, weight trend.  Reason for Assessment: MST  80 y.o. male  Admitting Dx: Subdural hematoma  ASSESSMENT: Patient transferred from [facility] to [facility] on 11/23 after CT showed worsening of patient's known left SDH.   Recent admission to [facility] for traumatic SDH, C. difficile colitis, multiple lung nodules, underwent IVC filter placement. Patient was d/c'ed to SNF.   Per discussion with RN, patient has not been eating much, lunch tray at bedside untouched. Family reported to RN that patient stopped feeding himself a couple weeks ago and has lost a lot of weight over the past few weeks. He has multiple wounds and would benefit from a nutrition supplement to maximize oral intake of protein and calories.  Nutrition Focused Physical Exam:  Subcutaneous Fat:  Orbital Region: mild depletion Upper Arm Region: mild depletion Thoracic and Lumbar Region: NA  Muscle:  Temple Region: mild depletion Clavicle Bone Region: severe depletion Clavicle and Acromion Bone Region: moderate depletion Scapular Bone Region: NA Dorsal Hand: moderate depletion Patellar Region: severe depletion Anterior Thigh Region: moderate depletion Posterior Calf Region: severe depletion  Edema:  none   Height: Ht Readings from Last 1 Encounters:  10/03/14 6' (1.829 m)    Weight: Wt Readings from Last 1 Encounters:  10/04/14 163 lb 9.3 oz (74.2 kg)    Ideal Body Weight: 80.9 kg  % Ideal Body Weight: 92% (suspect most of this weight loss occurred over the past 2-3 weeks)  Wt Readings from Last 10 Encounters:  10/04/14 163 lb 9.3 oz (74.2 kg)  12/29/13 178 lb (80.74 kg)    Usual Body Weight: 178 lb  % Usual Body Weight: 92%  BMI:  Body mass index is 22.18 kg/(m^2).  Estimated Nutritional Needs: Kcal: 2000-2200 Protein: 100-120 gm Fluid: 2-2.2 L  Skin: stage 2 pressure ulcer to buttocks, diabetic ulcer left heel, right foot wound  Diet Order: DIET DYS 3 with thin liquids  EDUCATION NEEDS: -Education not appropriate at this time   Intake/Output Summary (Last 24 hours) at 10/04/14 1431 Last data filed at 10/04/14 1245  Gross per 24 hour  Intake      0 ml  Output   1101 ml  Net  -1101 ml    Last BM: 11/23   Labs:   Recent Labs Lab 10/03/14 2250 10/04/14 0604  NA 134* 135*  K 4.0 4.2  CL 98 101  CO2 21 19  BUN 21 20  CREATININE 1.00 1.03  CALCIUM 8.5 8.7  GLUCOSE 140* 123*    CBG (last 3)   Recent Labs  10/04/14 0329 10/04/14 0737 10/04/14 1226  GLUCAP 117* 130* 147*    Scheduled Meds: . antiseptic oral rinse  7 mL Mouth Rinse q12n4p  . brimonidine  1 drop Both Eyes Daily  . chlorhexidine  15 mL Mouth Rinse BID  .  insulin aspart  0-9 Units Subcutaneous Q4H  . timolol  1 drop Both Eyes Daily  . Travoprost (BAK Free)  1 drop Both Eyes QHS    Continuous Infusions: . sodium chloride 75 mL/hr at 10/03/14 2354    Past Medical History  Diagnosis Date  . Bilateral swelling of feet   . Diabetes   . Hypertension     Past Surgical History  Procedure Laterality Date  . Prostate cancer       Molli Barrows, RD, LDN, [place] Pager [phone] After Hours Pager [phone]

## 2014-10-04 NOTE — Evaluation (Signed)
Clinical/Bedside Swallow Evaluation Patient Details  Name: Jacob Hodges MRN: [id number] Date of Birth: 1923/09/10  Today's Date: 10/04/2014 Time: 3149-7026 SLP Time Calculation (min) (ACUTE ONLY): 32 min  Past Medical History:  Past Medical History  Diagnosis Date  . Bilateral swelling of feet   . Diabetes   . Hypertension    Past Surgical History:  Past Surgical History  Procedure Laterality Date  . Prostate cancer     HPI:  80 y.o. male recently admitted to [facility] after traumatic SDH, managed conservatively. Dx included innumerable sclerotic lesions in the visualized cervical spine, ribs, and right proximal clavicle concerning for metastatic disease. Pt was referred to oncology.  D/Cd to SNF; found to have worsening confusion and anemia.  Admitted to [facility].  CCT showed worsening of known left SDH, size increased from 8 mm to 18 mm with with midline shift.  Transfered to [facility] 11/23.  Neurosurgery following.   Initial CCT: Multiple left cerebral and cerebellar subdural hemorrhages measuring up to 8 mm along the left convexity and 2 cm in thickness along the left falx and 1.3 cm along the left tentorium. Left lateral ventricle mass effect and 6 mm rightward midline shift. Subarachnoid hemorrhage within the foramen magnum.  Family describes decreased motivation to eat the last week, requiring total hand-feeding.  Pt was eating regular foods/liquids until recently at the SNF- diet was downgraded to purees.     Assessment / Plan / Recommendation Clinical Impression  Pt presents with a functional oropharyngeal swallow, with behaviors consistent with a dementia-based dysphagia.  Deficits at this time are primarily oral and related to prolonged mastication, oral holding of POs, with min-mod cues required to sustain motor act/attention to the swallowing process.  There were no overt s/s of aspiration.  Recommend initiating a dysphagia 3 diet, thin liquids.  Full supervision and assist with feeding as  needed.  Meds crushed in puree.  SLP to follow briefly for tolerance given waxing/waning MS.  Discussed with family recs and safe feeding practices given current cognitive status.      Aspiration Risk  Mild    Diet Recommendation Dysphagia 3 (Mechanical Soft);Thin liquid   Liquid Administration via: Cup Medication Administration: Crushed with puree Supervision: Staff to assist with self feeding;Full supervision/cueing for compensatory strategies Compensations: Check for pocketing Postural Changes and/or Swallow Maneuvers: Seated upright 90 degrees    Other  Recommendations Oral Care Recommendations: Oral care BID   Follow Up Recommendations   (tba)    Frequency and Duration min 2x/week  1 week       SLP Swallow Goals     Swallow Study Prior Functional Status       General Date of Onset: 09/17/14 HPI: 80 y.o. male recently admitted to [facility] after traumatic SDH, managed conservatively. Dx included innumerable sclerotic lesions in the visualized cervical spine, ribs, and right proximal clavicle concerning for metastatic disease. Pt was referred to oncology.  D/Cd to SNF; found to have worsening confusion and anemia.  Admitted to [facility].  CCT showed worsening of known left SDH, size increased from 8 mm to 18 mm with with midline shift.  Transfered to [facility] 11/23.  Neurosurgery following.   Initial CCT: Multiple left cerebral and cerebellar subdural hemorrhages measuring up to 8 mm along the left convexity and 2 cm in thickness along the left falx and 1.3 cm along the left tentorium. Left lateral ventricle mass effect and 6 mm rightward midline shift. Subarachnoid hemorrhage within the foramen magnum.  Family describes  decreased motivation to eat the last week, requiring total hand-feeding.  Pt was eating regular foods/liquids until recently at the SNF- diet was downgraded to purees.   Type of Study: Bedside swallow evaluation Previous Swallow Assessment: none per records Diet Prior to this  Study: NPO Temperature Spikes Noted: No Respiratory Status: Nasal cannula History of Recent Intubation: No Behavior/Cognition: Alert Oral Cavity - Dentition: Dentures, top Self-Feeding Abilities: Needs assist Patient Positioning: Upright in bed Baseline Vocal Quality: Clear Volitional Cough: Cognitively unable to elicit Volitional Swallow: Unable to elicit    Oral/Motor/Sensory Function Overall Oral Motor/Sensory Function:  (symmetrical at rest)   Ice Chips Ice chips: Within functional limits Presentation: Spoon   Thin Liquid Thin Liquid: Impaired Presentation: Cup Pharyngeal  Phase Impairments: Multiple swallows    Nectar Thick Nectar Thick Liquid: Not tested   Honey Thick Honey Thick Liquid: Not tested   Puree Puree: Impaired Presentation: Spoon Oral Phase Functional Implications: Prolonged oral transit;Oral residue Pharyngeal Phase Impairments: Suspected delayed Swallow   Solid  Edwardine Deschepper L. [place], [place] CCC/SLP Pager [phone]     Solid: Impaired Pharyngeal Phase Impairments: Suspected delayed Swallow       Rafeal Skibicki, [place]ill Bamberg Laurice 10/04/2014,11:30 AM

## 2014-10-04 NOTE — Consult Note (Signed)
WOC wound consult note Reason for Consult: evaluation of LE wounds. Pt with dementia, not able to answer my questions.  No family in the room at the time of my assessment.  Pt with no significant LE edema and bilateral palpable pulses.  Wound type: Unstageable Pressure ulcer right heel: 4cm x 3cm x 0 sDTI : right great toe 0.5cm x 1.0cm x 0  Non healing surgical wound right lateral foot: 3cm x 1.0cm x 0.2cm  sDTI (suspected deep tissue injury) x 2 left lateral foot: 1.0cm x 1.5cm x 0 proximal, 1.0cm x 0.5cm x 0 medial Stage III Pressure Ulcer left heel: mostly healed  (open area is 1.0cm x 1.0cm x 0.2cm) with 25% eschar at wound edge  Sacrum has healed.  Pressure Ulcer POA: Yes x 6  Measurement: see above Wound bed: All the open wounds are clean, pink, moist, minimal granulation tissue  Drainage (amount, consistency, odor) minimal, sanguineous, no odor  Periwound: intact  Dressing procedure/placement/frequency: Silicone foam to protect the heel ulcers.  NS moist gauze to the open surgical amputation site.  Sacral prophylactic dressing due to high risk for reoccurrence.  Turn patient side to side. Offloading boots while in bed. Monitor deep tissue injury sites for evolution.   Discussed POC with patient and bedside nurse.  Re consult if needed, will not follow at this time. Thanks  Forever Arechiga Kellogg, [place] [phone])

## 2014-10-04 NOTE — Consult Note (Signed)
CC:  Altered MS  HPI: Jacob Hodges is a 80 y.o. male was transferred to [facility] after initially presenting to [facility]. His history begins at the beginning of this month, on November 7 when he apparently suffered a traumatic subdural hematoma. He was transferred to [facility] and [place] where his intracranial injuries were managed conservatively. He was previously on Coumadin which was reversed. He was found to have DVT, and underwent IVC filter placement. He was ultimately transferred to nursing home. According to his family, he was apparently more confused, and less interactive, and was therefore taken from the nursing home to [facility]. Repeat CT scan can was done which demonstrated increased size of a left convexity subdural hematoma, with increased mass effect. The patient's family did not wish to be transferred to [facility] again, and he was therefore transferred here.  Of note, the patient is anemic, and during his workup at [facility] apparently was found to have multiple lung nodules. He was referred for outpatient oncology follow-up.  PMH: Past Medical History  Diagnosis Date  . Bilateral swelling of feet   . Diabetes   . Hypertension     PSH: Past Surgical History  Procedure Laterality Date  . Prostate cancer      SH: History  Substance Use Topics  . Smoking status: Never Smoker   . Smokeless tobacco: Never Used  . Alcohol Use: No    MEDS: Prior to Admission medications   Medication Sig Start Date End Date Taking? Authorizing Provider  amLODipine (NORVASC) 10 MG tablet Take 10 mg by mouth daily.   Yes Historical Provider, MD  atenolol (TENORMIN) 50 MG tablet Take 50 mg by mouth daily. 07/27/14  Yes Historical Provider, MD  pravastatin (PRAVACHOL) 20 MG tablet Take 20 mg by mouth at bedtime.    Yes Historical Provider, MD  timolol (BETIMOL) 0.5 % ophthalmic solution Place 1 drop into both eyes daily.   Yes Historical  Provider, MD  travoprost, benzalkonium, (TRAVATAN) 0.004 % ophthalmic solution Place 1 drop into both eyes at bedtime.   Yes Historical Provider, MD  ciprofloxacin (CIPRO) 500 MG tablet Take 1 tablet (500 mg total) by mouth 2 (two) times daily. 04/26/14   Wallene Huh, DPM  doxycycline (VIBRA-TABS) 100 MG tablet Take 1 tablet (100 mg total) by mouth 2 (two) times daily. 04/26/14   Wallene Huh, DPM  oxyCODONE-acetaminophen (PERCOCET) 10-325 MG per tablet Take 1 tablet by mouth every 8 (eight) hours as needed for pain. 04/26/14   Wallene Huh, DPM    ALLERGY: No Known Allergies  ROS: Unable to obtain ROS  NEUROLOGIC EXAM: Somnolent, but arouses to voice Says name Follows simple commands in BUE.  Does spontaneously move BLE  IMGAING: CT Head demonstrates left isodense chronic SDH of ~102mm. There is some left-sided edema with ~34mm of L->R MLS. There is persistent evolving left falcine SDH  IMPRESSION: - 80 y.o. male with evolving left SDH. While the SDH does appear to have increased in size with increase in MLS, given the patient's age, other medical comorbidities, I do not believe he is a surgical candidate. The chance that evacuation of the SDH will improve his clinical condition seems doubtful to me, and certainly carries the risk of transformation into an acute SDH.  PLAN: - Would continue to support medically with blood pressure control - Would not offer surgical intervention at this time.

## 2014-10-04 NOTE — Progress Notes (Signed)
[place] TEAM 1 - Stepdown/ICU TEAM Progress Note  LAMERE LIGHTNER [id number]:[id number] DOB: 1923-08-06 DOA: 10/03/2014 PCP: Debbora Dus, MD  Admit HPI / Brief Narrative: Daris P Frederic 80 y.o BM PMHx SDH,Prostate Ca, HLD, HTN, DVT,  Recently admitted to [facility] after patient had a traumatic subdural hematoma and was managed conservatively. During the course patient also was found to have C. difficile colitis and also had undergone IVC filter placement has history of DVT. Patient also was found to have multiple lung nodules for which further workup was planned as outpatient per oncologist. Patient was eventually discharged to nursing home following which patient was found to have increasing confusion and blood work showed worsening anemia. Patient was transferred to [facility]. Hemoglobin was found to be around 6.7. Since patient was found to be confused CT head was done which showed worsening of patient's known left subdural hematoma increasing size from 8 mm to 18 mm with midline shift. On-call neurosurgeon at [facility] by the ER physician as patient's family wanted lesion to be transferred to [facility]. Patient also had received 1 unit PRBC in the ER for the anemia. On exam patient appears confused and is oriented to his name only. Patient has known history of being weak on the right side.   Labs done at [facility] center showed sodium of 134 potassium 4.2 chloride of 103 bicarbonate of 53 BUN of 20 creatinine 1.1 glucose of 142 calcium of 8.1 hemoglobin of 6.7 platelets around 285.  HPI/Subjective: 11/24 A/O x1 (does not know where, when, why), Follows some Cmds.   Assessment/Plan: Subdural hematoma acute on chronic; with further worsening  -left subdural hematoma increasing size from 8 mm to 18 mm with midline shift. -Per Dr.Neelesh Nundkumar (neurosurgery) patient not candidate for aggressive intervention, recommends medical management only. -Repeat head  CT in 48 hours to determine aggression of SDH -CSW to arrange for family meeting (note wife deaf, daughter mute, niece Trached)   HTN -Hydralazine 5 mg IV q 4hr goal SBP 130-160  Severe anemia  -Received 1 unit of packed red blood cell transfusion in the [place] regional ER. -Trending H/H  Diabetes mellitus type 2  -Continue nothing by mouth - Control with sensitive SSI   DVT status post IVC filter placement recently. -Unfortunately secondary patient's SDH will be unable to place him on any anticoagulation. This significantly increases patient's likelihood of DVT, PE, CVA. -This issue needs to be discussed at length with family members.  prostate cancer. -11/16 patient was discharged from [facility] and appears the plan of care was for continued workup as outpatient oncology patient. There are no notes showing that patient follow-up at [facility], and secondary to patient's dementia unable to determine if patient actually was seen.   Bilateral lower extremity foot ulcers  - Patient without leukocytosis, or fever to denote current infection, considering patient's recent C. difficile infection would hold off on any antibiotics at this time.  -Per wound care;Silicone foam to protect the heel ulcers. NS moist gauze to the open surgical amputation site. Sacral prophylactic dressing   Hx recent C. difficile colitis for which patient has finished a course of Flagyl as per patient's family.   Code Status: FULL Family Communication: no family present at time of exam Disposition Plan: Per family meeting    Consultants: Dr. Roxana Hires (neurosurgery)    Procedure/Significant Events:    Culture NA  Antibiotics: NA  DVT prophylaxis: SCD   Devices NA  LINES / TUBES:      Continuous Infusions: . sodium chloride 75 mL/hr at 10/03/14 2354    Objective: VITAL SIGNS: Temp: 98.5 F (36.9 C) (11/24 1451) Temp Source: Oral (11/24 1451) BP: 170/80 mmHg (11/24  0805) SPO2; FIO2:   Intake/Output Summary (Last 24 hours) at 10/04/14 1522 Last data filed at 10/04/14 1245  Gross per 24 hour  Intake      0 ml  Output   1101 ml  Net  -1101 ml     Exam: General: A/O 1 (does not know where, when, why), will follow some commands, NAD, No acute respiratory distress Lungs: Clear to auscultation bilaterally without wheezes or crackles Cardiovascular: Regular rate and rhythm without murmur gallop or rub normal S1 and S2 Abdomen: Nontender, nondistended, soft, bowel sounds positive, no rebound, no ascites, no appreciable mass Extremities: No significant cyanosis, clubbing, or edema bilateral lower extremities, bilateral foot ulcers in Unna boots with clean dressing (did not remove for exam)  Data Reviewed: Basic Metabolic Panel:  Recent Labs Lab 10/03/14 2250 10/04/14 0604  NA 134* 135*  K 4.0 4.2  CL 98 101  CO2 21 19  GLUCOSE 140* 123*  BUN 21 20  CREATININE 1.00 1.03  CALCIUM 8.5 8.7   Liver Function Tests:  Recent Labs Lab 10/03/14 2250  AST 20  ALT 8  ALKPHOS 54  BILITOT 1.3*  PROT 6.4  ALBUMIN 2.3*   No results for input(s): LIPASE, AMYLASE in the last 168 hours. No results for input(s): AMMONIA in the last 168 hours. CBC:  Recent Labs Lab 10/03/14 2250 10/04/14 0604  WBC 8.0 8.0  NEUTROABS 6.1  --   HGB 8.7* 8.6*  HCT 26.9* 26.9*  MCV 87.1 84.6  PLT 386 379   Cardiac Enzymes: No results for input(s): CKTOTAL, CKMB, CKMBINDEX, TROPONINI in the last 168 hours. BNP (last 3 results) No results for input(s): PROBNP in the last 8760 hours. CBG:  Recent Labs Lab 10/03/14 2328 10/04/14 0329 10/04/14 0737 10/04/14 1226  GLUCAP 144* 117* 130* 147*    Recent Results (from the past 240 hour(s))  MRSA PCR Screening     Status: Abnormal   Collection Time: 10/03/14  8:57 PM  Result Value Ref Range Status   MRSA by PCR POSITIVE (A) NEGATIVE Final    Comment:        The GeneXpert MRSA Assay (FDA approved for  NASAL specimens only), is one component of a comprehensive MRSA colonization surveillance program. It is not intended to diagnose MRSA infection nor to guide or monitor treatment for MRSA infections. RESULT CALLED TO, READ BACK BY AND VERIFIED WITH: USSERY,K RN [phone] 10/04/14 MITCHELL,L      Studies:  Recent x-ray studies have been reviewed in detail by the Attending Physician  Scheduled Meds:  Scheduled Meds: . antiseptic oral rinse  7 mL Mouth Rinse q12n4p  . brimonidine  1 drop Both Eyes Daily  . chlorhexidine  15 mL Mouth Rinse BID  . feeding supplement (ENSURE COMPLETE)  237 mL Oral TID BM  . insulin aspart  0-9 Units Subcutaneous Q4H  . timolol  1 drop Both Eyes Daily  . Travoprost (BAK Free)  1 drop Both Eyes QHS    Time spent on care of this patient: 40 mins   Allie Bossier , MD   [facility] Office  [phone] Pager [phone]  On-Call/Text Page:      [email or web address]      password [email or web address]  If 7PM-7AM,  please contact night-coverage [email or web address] Password [email or web address] 10/04/2014, 3:22 PM   LOS: 1 day

## 2014-10-05 ENCOUNTER — Inpatient Hospital Stay (HOSPITAL_COMMUNITY): Payer: Medicare HMO

## 2014-10-05 DIAGNOSIS — E43 Unspecified severe protein-calorie malnutrition: Secondary | ICD-10-CM | POA: Insufficient documentation

## 2014-10-05 LAB — COMPREHENSIVE METABOLIC PANEL
ALBUMIN: 1.7 g/dL — AB (ref 3.5–5.2)
ALK PHOS: 148 U/L — AB (ref 39–117)
ALT: 20 U/L (ref 0–53)
AST: 30 U/L (ref 0–37)
Anion gap: 8 (ref 5–15)
BUN: 11 mg/dL (ref 6–23)
CHLORIDE: 98 meq/L (ref 96–112)
CO2: 27 meq/L (ref 19–32)
Calcium: 7.4 mg/dL — ABNORMAL LOW (ref 8.4–10.5)
Creatinine, Ser: 0.96 mg/dL (ref 0.50–1.35)
GFR calc Af Amer: 83 mL/min — ABNORMAL LOW (ref >90)
GFR, EST NON AFRICAN AMERICAN: 71 mL/min — AB (ref >90)
Glucose, Bld: 97 mg/dL (ref 70–99)
POTASSIUM: 3.9 meq/L (ref 3.7–5.3)
Sodium: 133 mEq/L — ABNORMAL LOW (ref 137–147)
Total Bilirubin: 1.7 mg/dL — ABNORMAL HIGH (ref 0.3–1.2)
Total Protein: 5.2 g/dL — ABNORMAL LOW (ref 6.0–8.3)

## 2014-10-05 LAB — CBC WITH DIFFERENTIAL/PLATELET
Basophils Absolute: 0 10*3/uL (ref 0.0–0.1)
Basophils Relative: 0 % (ref 0–1)
Eosinophils Absolute: 0.1 10*3/uL (ref 0.0–0.7)
Eosinophils Relative: 2 % (ref 0–5)
HCT: 27.1 % — ABNORMAL LOW (ref 39.0–52.0)
Hemoglobin: 8.7 g/dL — ABNORMAL LOW (ref 13.0–17.0)
Lymphocytes Relative: 15 % (ref 12–46)
Lymphs Abs: 1.1 10*3/uL (ref 0.7–4.0)
MCH: 27.2 pg (ref 26.0–34.0)
MCHC: 32.1 g/dL (ref 30.0–36.0)
MCV: 84.7 fL (ref 78.0–100.0)
MONOS PCT: 11 % (ref 3–12)
Monocytes Absolute: 0.8 10*3/uL (ref 0.1–1.0)
NEUTROS ABS: 4.9 10*3/uL (ref 1.7–7.7)
Neutrophils Relative %: 72 % (ref 43–77)
Platelets: 373 10*3/uL (ref 150–400)
RBC: 3.2 MIL/uL — ABNORMAL LOW (ref 4.22–5.81)
RDW: 16.1 % — ABNORMAL HIGH (ref 11.5–15.5)
WBC: 6.8 10*3/uL (ref 4.0–10.5)

## 2014-10-05 LAB — GLUCOSE, CAPILLARY
GLUCOSE-CAPILLARY: 94 mg/dL (ref 70–99)
GLUCOSE-CAPILLARY: 124 mg/dL — AB (ref 70–99)
GLUCOSE-CAPILLARY: 168 mg/dL — AB (ref 70–99)
GLUCOSE-CAPILLARY: 109 mg/dL — AB (ref 70–99)
Glucose-Capillary: 100 mg/dL — ABNORMAL HIGH (ref 70–99)
Glucose-Capillary: 144 mg/dL — ABNORMAL HIGH (ref 70–99)

## 2014-10-05 LAB — MAGNESIUM: Magnesium: 1.9 mg/dL (ref 1.5–2.5)

## 2014-10-05 MED ORDER — INSULIN ASPART 100 UNIT/ML ~~LOC~~ SOLN
0.0000 [IU] | Freq: Three times a day (TID) | SUBCUTANEOUS | Status: DC
  Administered 2014-10-07: 1 [IU] via SUBCUTANEOUS
  Administered 2014-10-07: 2 [IU] via SUBCUTANEOUS
  Administered 2014-10-07: 3 [IU] via SUBCUTANEOUS
  Administered 2014-10-08 (×2): 2 [IU] via SUBCUTANEOUS
  Administered 2014-10-08 – 2014-10-09 (×2): 1 [IU] via SUBCUTANEOUS
  Administered 2014-10-09: 5 [IU] via SUBCUTANEOUS
  Administered 2014-10-09: 1 [IU] via SUBCUTANEOUS
  Administered 2014-10-10: 2 [IU] via SUBCUTANEOUS
  Administered 2014-10-10: 1 [IU] via SUBCUTANEOUS

## 2014-10-05 NOTE — Clinical Social Work Psychosocial (Signed)
Clinical Social Work Department BRIEF PSYCHOSOCIAL ASSESSMENT 10/05/2014  Patient:  Jacob Hodges, Jacob Hodges     Account Number:  [id number]     Admit date:  10/03/2014  Clinical Social Worker:  Marciano Sequin  Date/Time:  10/05/2014 02:53 PM  Referred by:  RN  Date Referred:  10/04/2014 Referred for  SNF Placement   Other Referral:   Interview type:  Family Other interview type:   Pt is disoriented; Pt's wife May [phone], pt's daughter, pt's neice Caorlin [phone] and  sign language interpreter Isabel Caprice [phone])    PSYCHOSOCIAL DATA Living Status:  FACILITY Admitted from facility:  [facility] Level of care:  [place] Primary support name:  May Hewes Primary support relationship to patient:  SPOUSE Degree of support available:   Strong Support System    CURRENT CONCERNS Current Concerns  Post-Acute Placement   Other Concerns:    SOCIAL WORK ASSESSMENT / PLAN CSW conducted a family meeting with the pt's wife (May, who is hard of hearing, pt's daughter (who is hearing impaired and mute), the niece Heritage manager) via phone, and a sign language interpreter Rodman Key B). CSW introduced self and purpose of visit. Carolin reported the pt was receiving rehab from [facility] [employer]. May wrote on her note pad that she does not want her husband to return. Carolin reported the pt may receive better care from another SNF. CSW explained the SNF process to the family. May wrote on her note pad that she would like the pt to remain in [facility]. CSW provided the family with contact information for further questions. CSW will continue to follow this pt and assist with discharge as needed.   Assessment/plan status:  Psychosocial Support/Ongoing Assessment of Needs Other assessment/ plan:   Information/referral to community resources:   Medicaid Application    PATIENT'S/FAMILY'S RESPONSE TO PLAN OF CARE: The family had serveral concerns regarding the pt's  care at [facility]. The family acknowledged the pt needs rehab given his condition. The family expressed interested in [facility] or [facility].   [place], MSW, [place]

## 2014-10-05 NOTE — Progress Notes (Signed)
[name]  Jacob Hodges [id number] DOB: 08-30-1923 DOA: 10/03/2014 PCP: Debbora Dus, MD  Brief narrative: 80 year old male patient with known history of prostate cancer dyslipidemia hypertension and DVT. Recent admission to [facility] after traumatic subdural hematoma which was managed conservatively. During that hospitalization patient was treated for C. difficile colitis. In addition because of his history of DVT an IVC filter was placed. Of note patient also has 2 lung nodules with further workup recommended as an outpatient. He was discharged to a skilled nursing facility.  At the nursing facility patient had increasing confusion and routine blood work demonstrated anemia. Patient was subsequently sent to [facility] where his hemoglobin was found to be 6.7. Because of confusion and history of recent subdural hematoma CT of the head was completed which noted worsening of his known left subdural hematoma which had increased in size from 8 mm to 18 mm with a midline shift. The Neurosurgeon at [facility] was called by the ER physician since the patient's family wanted the patient transferred to [facility]. Because of his anemia the patient was given a unit of packed red blood cells in the ER before transfer to this facility  After arrival to our facility the patient was documented as being confused and oriented to his name only. Since the initial subdural hematoma patient has demonstrated right-sided weakness; unclear if current weakness the same or worse.  HPI/Subjective: Patient awake but confused. Nursing staff report patient with waxing and waning mentation.  Assessment/Plan:  Subdural hematoma acute on chronic -NS following -1 cm increase in size since initially diagnosed and now with midline shift -Neurosurgery documents patient not candidate for aggressive intervention so focus should be on medical management  only -Repeat head CT 11/26  -Given advanced age and multiple comorbidities, goals of care meeting should be held with the family primarily to discuss CODE STATUS during this hospital stay  -SLP eval rec D3 diet with thin liquids  HTN -With current subtle increase in subdural hematoma would avoid overcorrection of blood pressure -Hydralazine 5 mg IV q 4hr goal SBP 130-160  Severe anemia -Post 1 unit packed red blood cells prior to admission -Hemoglobin stable greater than 8.0 -Baseline hemoglobin around 9.2 based on records from [facility] FOB and anemia panel  Diabetes mellitus type 2 -Current CBGs controlled on sliding scale insulin alone  -Diet controlled prior to admission -Hemoglobin A1c 6.1  DVT status post IVC filter placement recently. -Unfortunately secondary patient's SDH will be unable to place him on any anticoagulation. This significantly increases patient's likelihood of DVT, PE, CVA.  ? Prostate cancer/unspecified pulmonary nodules/sclerotic bony lesions throughout axial skeleton -PSA at [facility] 11/8 was 3.2 and on 11/11 was 1.6 -11/16 patient was discharged from [facility] and appears the plan of care was for continued workup as outpatient -Of note imaging during hospitalization in November at [facility] revealed sclerotic bony lesions throughout the axial skeleton consistent with metastatic lesions and CT of the chest/abdomen/pelvis provided no primary malignancy although patient had 0.7 cm right apical and 0.4 cm right lung base pulmonary nodules; he also was found to have multiple thyroid nodules with the largest measuring 1.5 cm with recommendation to pursue dedicated thyroid ultrasound to further characterize  Multiple thyroid nodules -Seen on imaging at [facility] -TSH here elevated at 5.7 -Check free T4 and T3 -Radiologist at [facility] recommended dedicated thyroid ultrasound so we'll order  Bilateral lower extremity foot ulcers/recent  right fifth toe amputation for  osteomyelitis October 2015 [facility]) - Patient without leukocytosis, or fever to denote current infection, considering patient's recent C. difficile infection would hold off on empiric antibiotic coverage -Per wound care;Silicone foam to protect the heel ulcers. NS moist gauze to the open surgical amputation site. Sacral prophylactic dressing   Recent C. difficile colitis  -has finished a course of Flagyl as per patient's family -Repeat C. difficile PCR  DVT prophylaxis: SCDs Code Status: Full Family Communication: No family at bedside Disposition Plan/Expected LOS: Stepdown unit   Consultants: Neurosurgery/Dr. Kathyrn Sheriff  Procedures: None  Antibiotics: None  Objective: Blood pressure 151/70, pulse 68, temperature 98 F (36.7 C), temperature source Axillary, resp. rate 16, height 6' (1.829 m), weight 163 lb 9.3 oz (74.2 kg), SpO2 100 %.  Intake/Output Summary (Last 24 hours) at 10/05/14 1407 Last data filed at 10/05/14 1217  Gross per 24 hour  Intake    300 ml  Output    951 ml  Net   -651 ml   Exam: Gen: No acute respiratory distress-currently alert but confused, oriented to name only Chest: Clear to auscultation bilaterally without wheezes, rhonchi or crackles, room air Cardiac: Regular rate and rhythm, S1-S2, no rubs murmurs or gallops, no peripheral edema, no JVD Abdomen: Soft nontender nondistended without obvious hepatosplenomegaly, no ascites Extremities: Symmetrical in appearance without cyanosis, clubbing or effusion; bilateral foot ulcers with Unna boots in place  Scheduled Meds:  Scheduled Meds: . antiseptic oral rinse  7 mL Mouth Rinse q12n4p  . brimonidine  1 drop Both Eyes Daily  . chlorhexidine  15 mL Mouth Rinse BID  . feeding supplement (ENSURE COMPLETE)  237 mL Oral TID BM  . hydrALAZINE  5 mg Intravenous Q4H  . Influenza vac split quadrivalent PF  0.5 mL Intramuscular Tomorrow-1000  . insulin aspart  0-9 Units Subcutaneous Q4H  . pneumococcal 23  valent vaccine  0.5 mL Intramuscular Tomorrow-1000  . timolol  1 drop Both Eyes Daily  . Travoprost (BAK Free)  1 drop Both Eyes QHS   Data Reviewed: Basic Metabolic Panel:  Recent Labs Lab 10/03/14 2250 10/04/14 0604 10/05/14 0409  NA 134* 135* 133*  K 4.0 4.2 3.9  CL 98 101 98  CO2 21 19 27   GLUCOSE 140* 123* 97  BUN 21 20 11   CREATININE 1.00 1.03 0.96  CALCIUM 8.5 8.7 7.4*  MG  --   --  1.9   Liver Function Tests:  Recent Labs Lab 10/03/14 2250 10/05/14 0409  AST 20 30  ALT 8 20  ALKPHOS 54 148*  BILITOT 1.3* 1.7*  PROT 6.4 5.2*  ALBUMIN 2.3* 1.7*   CBC:  Recent Labs Lab 10/03/14 2250 10/04/14 0604 10/05/14 0809  WBC 8.0 8.0 6.8  NEUTROABS 6.1  --  4.9  HGB 8.7* 8.6* 8.7*  HCT 26.9* 26.9* 27.1*  MCV 87.1 84.6 84.7  PLT 386 379 373   CBG:  Recent Labs Lab 10/04/14 1636 10/04/14 2034 10/05/14 0006 10/05/14 0312 10/05/14 0800  GLUCAP 108* 115* 144* 94 100*    Recent Results (from the past 240 hour(s))  MRSA PCR Screening     Status: Abnormal   Collection Time: 10/03/14  8:57 PM  Result Value Ref Range Status   MRSA by PCR POSITIVE (A) NEGATIVE Final    Comment:        The GeneXpert MRSA Assay (FDA approved for NASAL specimens only), is one component of a comprehensive MRSA colonization surveillance program. It is  not intended to diagnose MRSA infection nor to guide or monitor treatment for MRSA infections. RESULT CALLED TO, READ BACK BY AND VERIFIED WITH: USSERY,K RN [phone] 10/04/14 MITCHELL,L      Studies:  Recent x-ray studies have been reviewed in detail by the Attending Physician  Time spent :  [place], ANP [facility] Office  [phone] Pager [phone]  On-Call/Text Page:      [email or web address]      password [email or web address]  If 7PM-7AM, please contact night-coverage [email or web address] Password [email or web address] 10/05/2014, 2:07 PM   LOS: 2 days   I have personally examined this patient and reviewed the entire database. I  have reviewed the above note, made any necessary editorial changes, and agree with its content.  Cherene Altes, MD [facility]

## 2014-10-05 NOTE — Progress Notes (Signed)
Utilization review completed. Idrees Quam, RN, BSN. 

## 2014-10-05 NOTE — Progress Notes (Signed)
Speech Language Pathology Treatment: Dysphagia  Patient Details Name: Jacob Hodges MRN: [id number] DOB: 09/08/1923 Today's Date: 10/05/2014 Time: 1230-1240 SLP Time Calculation (min) (ACUTE ONLY): 10 min  Assessment / Plan / Recommendation Clinical Impression  Pt alert, smiling, speaking minimally.  Consumed breakfast without difficulty per wife.  Pt observed with purees and thin liquids with excellent toleration; min cues needed for occasional oral holding, but airway protection is sufficient.  Allow pt to self-feed with left hand with assist from staff.  No further SLP needs identified - will sign off.     HPI HPI: 80 y.o. male recently admitted to [facility] after traumatic SDH, managed conservatively. Dx included innumerable sclerotic lesions in the visualized cervical spine, ribs, and right proximal clavicle concerning for metastatic disease. Pt was referred to oncology.  D/Cd to SNF; found to have worsening confusion and anemia.  Admitted to [facility].  CCT showed worsening of known left SDH, size increased from 8 mm to 18 mm with with midline shift.  Transfered to [facility] 11/23.  Neurosurgery following.   Initial CCT: Multiple left cerebral and cerebellar subdural hemorrhages measuring up to 8 mm along the left convexity and 2 cm in thickness along the left falx and 1.3 cm along the left tentorium. Left lateral ventricle mass effect and 6 mm rightward midline shift. Subarachnoid hemorrhage within the foramen magnum.  Family describes decreased motivation to eat the last week, requiring total hand-feeding.  Pt was eating regular foods/liquids until recently at the SNF- diet was downgraded to purees.     Pertinent Vitals Pain Assessment: Faces Faces Pain Scale: No hurt  SLP Plan  All goals met    Recommendations Diet recommendations: Dysphagia 3 (mechanical soft);Thin liquid Liquids provided via: Cup Medication Administration: Whole meds with liquid Supervision: Full supervision/cueing for compensatory  strategies Compensations: Check for pocketing Postural Changes and/or Swallow Maneuvers: Seated upright 90 degrees              Follow up Recommendations: Skilled Nursing facility Plan: All goals met        L. , MA CCC/SLP Pager [phone]  ,  Jacob Hodges 10/05/2014, 2:18 PM    

## 2014-10-05 NOTE — Progress Notes (Signed)
No issues overnight.   EXAM:  BP 156/92 mmHg  Pulse 68  Temp(Src) 98 F (36.7 C) (Axillary)  Resp 16  Ht 6' (1.829 m)  Wt 74.2 kg (163 lb 9.3 oz)  BMI 22.18 kg/m2  SpO2 100%  Pt remains confused Will say name Follows simple commands occasionally Mild right hemiparesis  IMPRESSION:  80 y.o. male with multiple medical comorbidities and subacute/chronic left SDH with mass effect Surgical evacuation of the hematoma I believe to be unlikely to improve pts overall condition.  PLAN: - Cont current supportive care - Family meeting for goals of care

## 2014-10-06 ENCOUNTER — Encounter (HOSPITAL_COMMUNITY): Payer: Self-pay | Admitting: Radiology

## 2014-10-06 ENCOUNTER — Inpatient Hospital Stay (HOSPITAL_COMMUNITY): Payer: Medicare HMO

## 2014-10-06 DIAGNOSIS — R1310 Dysphagia, unspecified: Secondary | ICD-10-CM

## 2014-10-06 DIAGNOSIS — E0781 Sick-euthyroid syndrome: Secondary | ICD-10-CM | POA: Insufficient documentation

## 2014-10-06 DIAGNOSIS — E785 Hyperlipidemia, unspecified: Secondary | ICD-10-CM

## 2014-10-06 LAB — COMPREHENSIVE METABOLIC PANEL
ALBUMIN: 2.4 g/dL — AB (ref 3.5–5.2)
ALK PHOS: 55 U/L (ref 39–117)
ALT: 8 U/L (ref 0–53)
ANION GAP: 14 (ref 5–15)
AST: 15 U/L (ref 0–37)
BUN: 22 mg/dL (ref 6–23)
CO2: 21 mEq/L (ref 19–32)
Calcium: 9 mg/dL (ref 8.4–10.5)
Chloride: 99 mEq/L (ref 96–112)
Creatinine, Ser: 1.06 mg/dL (ref 0.50–1.35)
GFR calc Af Amer: 70 mL/min — ABNORMAL LOW (ref >90)
GFR calc non Af Amer: 60 mL/min — ABNORMAL LOW (ref >90)
Glucose, Bld: 231 mg/dL — ABNORMAL HIGH (ref 70–99)
POTASSIUM: 3.9 meq/L (ref 3.7–5.3)
Sodium: 134 mEq/L — ABNORMAL LOW (ref 137–147)
Total Bilirubin: 0.3 mg/dL (ref 0.3–1.2)
Total Protein: 6.6 g/dL (ref 6.0–8.3)

## 2014-10-06 LAB — GLUCOSE, CAPILLARY
GLUCOSE-CAPILLARY: 120 mg/dL — AB (ref 70–99)
GLUCOSE-CAPILLARY: 185 mg/dL — AB (ref 70–99)
Glucose-Capillary: 111 mg/dL — ABNORMAL HIGH (ref 70–99)
Glucose-Capillary: 184 mg/dL — ABNORMAL HIGH (ref 70–99)

## 2014-10-06 LAB — CBC WITH DIFFERENTIAL/PLATELET
BASOS PCT: 0 % (ref 0–1)
Basophils Absolute: 0 10*3/uL (ref 0.0–0.1)
Eosinophils Absolute: 0.1 10*3/uL (ref 0.0–0.7)
Eosinophils Relative: 2 % (ref 0–5)
HEMATOCRIT: 26.6 % — AB (ref 39.0–52.0)
Hemoglobin: 8.4 g/dL — ABNORMAL LOW (ref 13.0–17.0)
Lymphocytes Relative: 14 % (ref 12–46)
Lymphs Abs: 0.9 10*3/uL (ref 0.7–4.0)
MCH: 26.9 pg (ref 26.0–34.0)
MCHC: 31.6 g/dL (ref 30.0–36.0)
MCV: 85.3 fL (ref 78.0–100.0)
MONOS PCT: 7 % (ref 3–12)
Monocytes Absolute: 0.5 10*3/uL (ref 0.1–1.0)
NEUTROS ABS: 5.3 10*3/uL (ref 1.7–7.7)
Neutrophils Relative %: 77 % (ref 43–77)
Platelets: 383 10*3/uL (ref 150–400)
RBC: 3.12 MIL/uL — ABNORMAL LOW (ref 4.22–5.81)
RDW: 16.1 % — ABNORMAL HIGH (ref 11.5–15.5)
WBC: 6.8 10*3/uL (ref 4.0–10.5)

## 2014-10-06 LAB — IRON AND TIBC
Iron: 12 ug/dL — ABNORMAL LOW (ref 42–135)
Saturation Ratios: 8 % — ABNORMAL LOW (ref 20–55)
TIBC: 152 ug/dL — ABNORMAL LOW (ref 215–435)
UIBC: 140 ug/dL (ref 125–400)

## 2014-10-06 LAB — RETICULOCYTES
RBC.: 3.19 MIL/uL — AB (ref 4.22–5.81)
RETIC COUNT ABSOLUTE: 47.9 10*3/uL (ref 19.0–186.0)
Retic Ct Pct: 1.5 % (ref 0.4–3.1)

## 2014-10-06 LAB — FOLATE: Folate: 7.3 ng/mL

## 2014-10-06 LAB — VITAMIN B12: Vitamin B-12: 464 pg/mL (ref 211–911)

## 2014-10-06 LAB — FERRITIN: Ferritin: 855 ng/mL — ABNORMAL HIGH (ref 22–322)

## 2014-10-06 LAB — T4, FREE: FREE T4: 1.06 ng/dL (ref 0.80–1.80)

## 2014-10-06 LAB — MAGNESIUM: Magnesium: 1.9 mg/dL (ref 1.5–2.5)

## 2014-10-06 LAB — T3: T3 TOTAL: 76.5 ng/dL — AB (ref 80.0–204.0)

## 2014-10-06 NOTE — Progress Notes (Signed)
No change in exam. Nods that he's doing ok. MAE. Awake and responsive

## 2014-10-06 NOTE — Progress Notes (Signed)
[facility] TEAM 1 - Stepdown/ICU TEAM Progress Note  Jacob Hodges JSH:[id number] DOB: 05-Sep-1923 DOA: 10/03/2014 PCP: Debbora Dus, MD  Admit HPI / Brief Narrative: Jacob [facility]ges 80 y.o BM PMHx SDH,Prostate Ca, HLD, HTN, DVT,  Recently admitted to [facility] after patient had a traumatic subdural hematoma and was managed conservatively. During the course patient also was found to have C. difficile colitis and also had undergone IVC filter placement has history of DVT. Patient also was found to have multiple lung nodules for which further workup was planned as outpatient per oncologist. Patient was eventually discharged to nursing home following which patient was found to have increasing confusion and blood work showed worsening anemia. Patient was transferred to [facility]. Hemoglobin was found to be around 6.7. Since patient was found to be confused CT head was done which showed worsening of patient's known left subdural hematoma increasing size from 8 mm to 18 mm with midline shift. On-call neurosurgeon at [facility] by the ER physician as patient's family wanted lesion to be transferred to [facility]. Patient also had received 1 unit PRBC in the ER for the anemia. On exam patient appears confused and is oriented to his name only. Patient has known history of being weak on the right side.   Labs done at [facility] center showed sodium of 134 potassium 4.2 chloride of 103 bicarbonate of 53 BUN of 20 creatinine 1.1 glucose of 142 calcium of 8.1 hemoglobin of 6.7 platelets around 285.  HPI/Subjective: 11/26 A/O x1 (does not know where, when, why), Follows some Cmds.   Assessment/Plan: Subdural hematoma acute on chronic; with further worsening  -left subdural hematoma increasing size from 8 mm to 18 mm with midline shift. -Per Dr.Neelesh Nundkumar (neurosurgery) patient not candidate for aggressive intervention, recommends medical management only. -Repeat head  CT in 48 hours to determine aggression of SDH -CSW met with family and family agrees that patient needs rehabilitation however the likelihood of patient meeting rehabilitation criteria is remote. -Palliative care consult to arrange for family meeting (note wife deaf, daughter mute, niece Trached); family has totally unrealistic expectations of patient's recovery.   HTN -Hydralazine 5 mg IV q 4hr goal SBP 130-160  Severe anemia  -Received 1 unit of packed red blood cell transfusion in the [place] regional ER. -Trending H/H  Diabetes mellitus type 2  -Continue dysphagia 1 diet  - Control with sensitive SSI   DVT status post IVC filter placement recently. -Unfortunately secondary patient's SDH will be unable to place him on any anticoagulation. This significantly increases patient's likelihood of DVT, PE, CVA. -This issue needs to be discussed at length with family members.  Metastatic prostate cancer. -11/16 patient was discharged from [facility] and appears the plan of care was for continued workup as outpatient oncology patient. There are no notes showing that patient follow-up at [facility], and secondary to patient's dementia unable to determine if patient actually was seen.   Bilateral lower extremity foot ulcers  - Patient without leukocytosis, or fever to denote current infection, considering patient's recent C. difficile infection would hold off on any antibiotics at this time.  -Per wound care;Silicone foam to protect the heel ulcers. NS moist gauze to the open surgical amputation site. Sacral prophylactic dressing  -Hx recent C. difficile colitis for which patient has finished a course of Flagyl as per patient's family.  Sick euthyroid   -Patient's thyroid studies more consistent with sick euthyroid though could be slightly subtherapeutic  hypothyroidism. Would not treat at this time and if patient survives would follow as outpatient     Code Status: FULL Family Communication: no family  present at time of exam Disposition Plan: Palliative care consult for  family meeting pending    Consultants: Dr. Roxana Hires (neurosurgery)    Procedure/Significant Events:    Culture NA  Antibiotics: NA  DVT prophylaxis: SCD   Devices NA   LINES / TUBES:      Continuous Infusions:    Objective: VITAL SIGNS: Temp: 98.8 F (37.1 C) (11/26 1243) Temp Source: Oral (11/26 1243) BP: 147/78 mmHg (11/26 0736) Pulse Rate: 81 (11/26 0736) SPO2; FIO2:   Intake/Output Summary (Last 24 hours) at 10/06/14 1521 Last data filed at 10/06/14 1100  Gross per 24 hour  Intake    240 ml  Output    475 ml  Net   -235 ml     Exam: General: A/O 1 (does not know where, when, why), will follow some commands, NAD, No acute respiratory distress Lungs: Clear to auscultation bilaterally without wheezes or crackles Cardiovascular: Regular rate and rhythm without murmur gallop or rub normal S1 and S2 Abdomen: Nontender, nondistended, soft, bowel sounds positive, no rebound, no ascites, no appreciable mass Extremities: No significant cyanosis, clubbing, or edema bilateral lower extremities, bilateral foot ulcers in Unna boots with clean dressing (did not remove for exam)  Data Reviewed: Basic Metabolic Panel:  Recent Labs Lab 10/03/14 2250 10/04/14 0604 10/05/14 0409 10/06/14 0240  NA 134* 135* 133* 134*  K 4.0 4.2 3.9 3.9  CL 98 101 98 99  CO2 21 19 27 21   GLUCOSE 140* 123* 97 231*  BUN 21 20 11 22   CREATININE 1.00 1.03 0.96 1.06  CALCIUM 8.5 8.7 7.4* 9.0  MG  --   --  1.9 1.9   Liver Function Tests:  Recent Labs Lab 10/03/14 2250 10/05/14 0409 10/06/14 0240  AST 20 30 15   ALT 8 20 8   ALKPHOS 54 148* 55  BILITOT 1.3* 1.7* 0.3  PROT 6.4 5.2* 6.6  ALBUMIN 2.3* 1.7* 2.4*   No results for input(s): LIPASE, AMYLASE in the last 168 hours. No results for input(s): AMMONIA in the last 168 hours. CBC:  Recent Labs Lab 10/03/14 2250 10/04/14 0604  10/05/14 0809 10/06/14 0240  WBC 8.0 8.0 6.8 6.8  NEUTROABS 6.1  --  4.9 5.3  HGB 8.7* 8.6* 8.7* 8.4*  HCT 26.9* 26.9* 27.1* 26.6*  MCV 87.1 84.6 84.7 85.3  PLT 386 379 373 383   Cardiac Enzymes: No results for input(s): CKTOTAL, CKMB, CKMBINDEX, TROPONINI in the last 168 hours. BNP (last 3 results) No results for input(s): PROBNP in the last 8760 hours. CBG:  Recent Labs Lab 10/05/14 1209 10/05/14 1613 10/05/14 2147 10/06/14 0735 10/06/14 1220  GLUCAP 124* 168* 109* 120* 185*    Recent Results (from the past 240 hour(s))  MRSA PCR Screening     Status: Abnormal   Collection Time: 10/03/14  8:57 PM  Result Value Ref Range Status   MRSA by PCR POSITIVE (A) NEGATIVE Final    Comment:        The GeneXpert MRSA Assay (FDA approved for NASAL specimens only), is one component of a comprehensive MRSA colonization surveillance program. It is not intended to diagnose MRSA infection nor to guide or monitor treatment for MRSA infections. RESULT CALLED TO, READ BACK BY AND VERIFIED WITH: USSERY,K RN [phone] 10/04/14 MITCHELL,L      Studies:  Recent  x-ray studies have been reviewed in detail by the Attending Physician  Scheduled Meds:  Scheduled Meds: . antiseptic oral rinse  7 mL Mouth Rinse q12n4p  . brimonidine  1 drop Both Eyes Daily  . chlorhexidine  15 mL Mouth Rinse BID  . feeding supplement (ENSURE COMPLETE)  237 mL Oral TID BM  . hydrALAZINE  5 mg Intravenous Q4H  . Influenza vac split quadrivalent PF  0.5 mL Intramuscular Tomorrow-1000  . insulin aspart  0-9 Units Subcutaneous TID WC  . pneumococcal 23 valent vaccine  0.5 mL Intramuscular Tomorrow-1000  . timolol  1 drop Both Eyes Daily  . Travoprost (BAK Free)  1 drop Both Eyes QHS    Time spent on care of this patient: 40 mins   Allie Bossier , MD   [facility] Office  [phone] Pager [phone]  On-Call/Text Page:      [email or web address]      password [email or web address]  If 7PM-7AM, please contact  night-coverage [email or web address] Password [email or web address] 10/06/2014, 3:21 PM   LOS: 3 days

## 2014-10-07 ENCOUNTER — Encounter (HOSPITAL_COMMUNITY): Payer: Self-pay | Admitting: Internal Medicine

## 2014-10-07 DIAGNOSIS — S065X9A Traumatic subdural hemorrhage with loss of consciousness of unspecified duration, initial encounter: Secondary | ICD-10-CM | POA: Diagnosis not present

## 2014-10-07 LAB — GLUCOSE, CAPILLARY
GLUCOSE-CAPILLARY: 174 mg/dL — AB (ref 70–99)
Glucose-Capillary: 141 mg/dL — ABNORMAL HIGH (ref 70–99)
Glucose-Capillary: 211 mg/dL — ABNORMAL HIGH (ref 70–99)

## 2014-10-07 MED ORDER — TIMOLOL HEMIHYDRATE 0.5 % OP SOLN: 1.0000 [drp] | Freq: Every day | OPHTHALMIC | Status: DC

## 2014-10-07 MED ORDER — ATENOLOL 25 MG PO TABS
12.5000 mg | ORAL_TABLET | Freq: Every day | ORAL | Status: DC
  Administered 2014-10-08 – 2014-10-10 (×3): 12.5 mg via ORAL
  Filled 2014-10-07 (×3): qty 1

## 2014-10-07 MED ORDER — OXYCODONE HCL 5 MG PO TABS: 5.0000 mg | ORAL_TABLET | ORAL | Status: DC | PRN

## 2014-10-07 MED ORDER — TRAMADOL HCL 50 MG PO TABS: 50.0000 mg | ORAL_TABLET | Freq: Four times a day (QID) | ORAL | Status: DC | PRN

## 2014-10-07 MED ORDER — ATENOLOL 50 MG PO TABS: 50.0000 mg | ORAL_TABLET | Freq: Every day | ORAL | Status: DC

## 2014-10-07 NOTE — Progress Notes (Signed)
Attempted to notify pts wife of pt transfer.

## 2014-10-07 NOTE — Progress Notes (Signed)
Report called to Christie on 4N. Pt to be transferred to 4N16.

## 2014-10-07 NOTE — Progress Notes (Signed)
[facility] - Stepdown / ICU Progress Note  Jacob [facility]ges Jacob [facility]ges DOB: Jacob Hodges DOA: 10/03/2014 PCP: Debbora Dus, MD  Brief narrative: 80 year old male patient with known history of prostate cancer, dyslipidemia, hypertension, and DVT. Recent admission to [facility] after traumatic subdural hematoma which was managed conservatively. During that hospitalization patient was treated for C. difficile colitis. In addition because of his history of DVT an IVC filter was placed. Of note patient also has 2 lung nodules with further workup recommended as an outpatient. He was discharged to a skilled nursing facility.  At [facility] facility patient had increasing confusion and routine blood work demonstrated anemia. Patient was subsequently sent to [facility] where his hemoglobin was found to be 6.7. Because of confusion and history of recent subdural hematoma CT of [facility] head was completed which noted worsening of his known left subdural hematoma which had increased in size from 8 mm to 18 mm with a midline shift. [facility] Neurosurgeon at [facility] was called by [facility] ER physician since Jacob patient's family wanted Jacob patient transferred to [facility]. Because of his anemia Jacob patient was given a unit of packed red blood cells in [facility] ER before transfer to this facility  After arrival to our facility Jacob patient was confused and oriented to his name only.  With supportive care and time, his mental status has slowly begun to improve.  No interventions have been required.  NS is following along with [place].   HPI/Subjective: Pt is alert and conversant this morning.  He denies any complaints, though he is somewhat confused. There is no family present.    Assessment/Plan:  Subdural hematoma acute on chronic -NS following -Hodges cm increase in size since initially diagnosed and now with midline shift -Neurosurgery documents patient not candidate for aggressive intervention so focus  should be on medical management only -Repeat head CT 11/26 notes SDH stable at up to 2cm thickness -SLP eval rec D3 diet with thin liquids  HTN -BP currently reasonably controlled - avoid over aggressive correction in setting of suspected increased ICP  Severe anemia -Post Hodges unit packed red blood cells prior to admission -Hemoglobin stable greater than 8.0 -Baseline hemoglobin around 9.2 based on records from [facility] -Fe studies c/w anemia of "chronic disease" - likely related to poor intake/nutritional status   Diabetes mellitus type 2 -Current CBGs reasonably controlled on sliding scale insulin   -Diet controlled prior to admission -A1c 6.Hodges  DVT status post IVC filter placement recently -Unfortunately secondary patient's SDH will be unable to place him on any anticoagulation  ? Prostate cancer / unspecified pulmonary nodules / sclerotic bony lesions throughout axial skeleton -PSA at [facility] 11/8 was 3.2 and on 11/11 was Hodges.6 -11/16 patient was discharged from [facility] and appears Jacob plan of care was for continued workup as outpatient -Of note imaging during hospitalization in November at [facility] revealed sclerotic bony lesions throughout Jacob axial skeleton consistent with metastatic lesions and CT of Jacob chest/abdomen/pelvis provided no primary malignancy although patient had 0.7 cm right apical and 0.4 cm right lung base pulmonary nodules; he also was found to have multiple thyroid nodules with Jacob largest measuring Hodges.5 cm with recommendation to pursue dedicated thyroid ultrasound to further characterize -continued outpt work up appears to be Jacob most appropriate  Multiple thyroid nodules -Seen on imaging at [facility] -TSH here elevated at 5.7 -Free T4 normal and T3 total essentially low normal  -Thyroid US 11/25:  Very difficult study with small thyroid. Heterogeneous areas within both lobes of Jacob thyroid are difficult to measure. There are areas of calcification with  shadowing.  Bilateral lower extremity foot ulcers / recent right fifth toe amputation for osteomyelitis October 2015 [facility] [place]) -Patient without leukocytosis, or fever to denote current infection, considering patient's recent C. difficile infection would hold off on empiric antibiotic coverage -Per wound care;  Silicone foam to protect Jacob heel ulcers. NS moist gauze to Jacob open surgical amputation site. Sacral prophylactic dressing   Recent C. difficile colitis  -has finished a course of Flagyl as per patient's family -repeat C. difficile PCR not obtained as pt NOT having diarrhea   MRSA screen + -cont usual preacautions  DVT prophylaxis: SCDs Code Status: Full Family Communication: No family at bedside Disposition Plan/Expected LOS: stable for transfer to neuro med bed - begin PT/OT - family desires search for new/different SNF  Consultants: Neurosurgery  Procedures: None  Antibiotics: None  Objective: Blood pressure 129/59, pulse 83, temperature 98.5 F (36.9 C), temperature source Oral, resp. rate 18, height 6' (Hodges.829 m), weight 74.5 kg (164 lb 3.9 oz), SpO2 100 %.  Intake/Output Summary (Last 24 hours) at 10/07/14 1149 Last data filed at 10/07/14 7846  Gross per 24 hour  Intake    837 ml  Output    575 ml  Net    262 ml   Exam: Gen: No acute respiratory distress-currently alert and conversant but mildly confused  Chest: Clear to auscultation bilaterally without wheezes or crackles  Cardiac: Regular rate and rhythm, no appreciable M Abdomen: Soft nontender nondistended without obvious hepatosplenomegaly, no ascites Extremities: no signif C/C/E B LE   Scheduled Meds:  Scheduled Meds: . antiseptic oral rinse  7 mL Mouth Rinse q12n4p  . brimonidine  Hodges drop Both Eyes Daily  . chlorhexidine  15 mL Mouth Rinse BID  . feeding supplement (ENSURE COMPLETE)  237 mL Oral TID BM  . hydrALAZINE  5 mg Intravenous Q4H  . insulin aspart  0-9 Units Subcutaneous TID WC  .  pneumococcal 23 valent vaccine  0.5 mL Intramuscular Tomorrow-1000  . timolol  Hodges drop Both Eyes Daily  . Travoprost (BAK Free)  Hodges drop Both Eyes QHS   Data Reviewed: Basic Metabolic Panel:  Recent Labs Lab 10/03/14 2250 10/04/14 0604 10/05/14 0409 10/06/14 0240  NA 134* 135* 133* 134*  K 4.0 4.2 3.9 3.9  CL 98 101 98 99  CO2 21 19 27 21   GLUCOSE 140* 123* 97 231*  BUN 21 20 11 22   CREATININE Hodges.00 Hodges.03 0.96 Hodges.06  CALCIUM 8.5 8.7 7.4* 9.0  MG  --   --  Hodges.9 Hodges.9   Liver Function Tests:  Recent Labs Lab 10/03/14 2250 10/05/14 0409 10/06/14 0240  AST 20 30 15   ALT 8 20 8   ALKPHOS 54 148* 55  BILITOT Hodges.3* Hodges.7* 0.3  PROT 6.4 5.2* 6.6  ALBUMIN 2.3* Hodges.7* 2.4*   CBC:  Recent Labs Lab 10/03/14 2250 10/04/14 0604 10/05/14 0809 10/06/14 0240  WBC 8.0 8.0 6.8 6.8  NEUTROABS 6.Hodges  --  4.9 5.3  HGB 8.7* 8.6* 8.7* 8.4*  HCT 26.9* 26.9* 27.Hodges* 26.6*  MCV 87.Hodges 84.6 84.7 85.3  PLT 386 379 373 383   CBG:  Recent Labs Lab 10/06/14 0735 10/06/14 1220 10/06/14 1715 10/06/14 2144 10/07/14 0844  GLUCAP 120* 185* 111* 184* 141*    Recent Results (from Jacob past 240 hour(s))  MRSA PCR Screening  Status: Abnormal   Collection Time: 10/03/14  8:57 PM  Result Value Ref Range Status   MRSA by PCR POSITIVE (A) NEGATIVE Final    Comment:        Jacob GeneXpert MRSA Assay (FDA approved for NASAL specimens only), is one component of a comprehensive MRSA colonization surveillance program. It is not intended to diagnose MRSA infection nor to guide or monitor treatment for MRSA infections. RESULT CALLED TO, READ BACK BY AND VERIFIED WITH: USSERY,K RN [phone] 10/04/14 MITCHELL,L      Studies:  Recent x-ray studies have been reviewed in detail by Jacob Attending Physician  Time spent :  69 mins   Cherene Altes, MD [facility] For Consults/Admissions - Flow Manager - [phone] Office  [phone] Pager [phone]  On-Call/Text Page:      [email or web address] [email or web address]       password [facility]  10/07/2014, 11:49 AM   LOS: 4 days

## 2014-10-07 NOTE — Evaluation (Signed)
Physical Therapy Evaluation Patient Details Name: Jacob Hodges MRN: [id number] DOB: Mar 16, 1923 Today's Date: 10/07/2014   History of Present Illness  Pt adm with worsening lt SDH. Pt with recent adm at [facility] for traumatic lt SDH after fall. Pt was at SNF but had worsening confusion and now adm with worsening SDH. Pt also with c-diff and IVC filter. 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 PMH - Prostate CA.  Clinical Impression  Pt admitted with above. Pt currently with functional limitations due to the deficits listed below (see PT Problem List).  Pt will benefit from skilled PT to increase their independence and safety with mobility to allow discharge to the venue listed below.       Follow Up Recommendations SNF    Equipment Recommendations  None recommended by PT    Recommendations for Other Services       Precautions / Restrictions        Mobility  Bed Mobility Overal bed mobility: Needs Assistance Bed Mobility: Supine to Sit;Sit to Supine     Supine to sit: Total assist;HOB elevated Sit to supine: Total assist   General bed mobility comments: Assist to bring legs off bed and to elevate trunk into sitting. Assist to control descent of trunk and to bring feet back up into bed.  Transfers                    Ambulation/Gait                Stairs            Wheelchair Mobility    Modified Rankin (Stroke Patients Only)       Balance Overall balance assessment: Needs assistance Sitting-balance support: Single extremity supported;Feet supported Sitting balance-Leahy Scale: Poor Sitting balance - Comments: Pt sat EOB x 8-10 minutes with min to mod A. Pt with tendency to push with lt upper extremity causing him to lean to rt. Pt also with posterior lean. Had pt prop on lt elbow to reduce pushing. Postural control: Right lateral lean;Posterior lean                                   Pertinent  Vitals/Pain Pain Assessment: No/denies pain    Home Living Family/patient expects to be discharged to:: Skilled nursing facility                      Prior Function           Comments: Unsure of status prior to fall and SDH.     Hand Dominance        Extremity/Trunk Assessment   Upper Extremity Assessment: Defer to OT evaluation           Lower Extremity Assessment: RLE deficits/detail;LLE deficits/detail RLE Deficits / Details: Pt with flexor tone. Pt holds with leg flexed and in external rotation - with constant pressure able to push leg back into neutral.  LLE Deficits / Details: Active movement but not consistently following commands. Range of motion WFL.     Communication      Cognition     Overall Cognitive Status: No family/caregiver present to determine baseline cognitive functioning Area of Impairment: Orientation;Following commands Orientation Level: Disoriented to;Place;Time;Situation     Following Commands: Follows one step commands inconsistently  General Comments      Exercises        Assessment/Plan    PT Assessment Patient needs continued PT services  PT Diagnosis Generalized weakness;Hemiplegia dominant side;Altered mental status   PT Problem List Decreased strength;Decreased range of motion;Decreased activity tolerance;Decreased balance;Decreased mobility;Decreased cognition  PT Treatment Interventions DME instruction;Therapeutic activities;Therapeutic exercise;Functional mobility training;Balance training;Patient/family education   PT Goals (Current goals can be found in the Care Plan section) Acute Rehab PT Goals Patient Stated Goal: not stated PT Goal Formulation: Patient unable to participate in goal setting Time For Goal Achievement: 10/21/14 Potential to Achieve Goals: Fair    Frequency Min 2X/week   Barriers to discharge        Co-evaluation               End of Session   Activity  Tolerance: Patient tolerated treatment well Patient left: in bed;with call bell/phone within reach;with bed alarm set Nurse Communication: Mobility status         Time: 5638-9373 PT Time Calculation (min) (ACUTE ONLY): 17 min   Charges:   PT Evaluation $Initial PT Evaluation Tier I: 1 Procedure PT Treatments $Therapeutic Activity: 8-22 mins   PT G Codes:          Shareka Casale 10/13/2014, 4:07 PM  [employer] PT [phone]

## 2014-10-07 NOTE — Progress Notes (Signed)
Pt arrived to 4N16 at current time.  Pt A&O x 2,  Pt V/S taken,Pt without distress.  Will monitor.

## 2014-10-07 NOTE — Progress Notes (Signed)
Thank you for consulting the Palliative Medicine Team at [facility] to meet your patient's and family's needs.   The reason that you asked [place] to see your patient is  For Clarification of GOC and options  Left phone messages for wife and niece in attempt to schedule a family meeting.  Still await callback  Wadie Lessen NP  Palliative Medicine Team Team Phone # [phone] Pager [phone]

## 2014-10-07 NOTE — Progress Notes (Signed)
He is awake and alert and watching TV. He states he is doing well without headache. Following.

## 2014-10-07 NOTE — Clinical Social Work Note (Signed)
CSW left voice message with pt's wife May and pt's niece Carolin regarding bed offers. Currently [facility] is the only SNFs in [facility] that is offering a bed. CSW is awaiting a call back to dicussed bed offers with family. CSW will continue to follow and assist with discharge needs.   [place], MSW, [place]

## 2014-10-07 NOTE — Progress Notes (Signed)
Attempted to call report

## 2014-10-08 LAB — COMPREHENSIVE METABOLIC PANEL
ALK PHOS: 48 U/L (ref 39–117)
ALT: 9 U/L (ref 0–53)
ANION GAP: 11 (ref 5–15)
AST: 18 U/L (ref 0–37)
Albumin: 2.4 g/dL — ABNORMAL LOW (ref 3.5–5.2)
BUN: 25 mg/dL — AB (ref 6–23)
CO2: 24 mEq/L (ref 19–32)
Calcium: 8.6 mg/dL (ref 8.4–10.5)
Chloride: 104 mEq/L (ref 96–112)
Creatinine, Ser: 0.99 mg/dL (ref 0.50–1.35)
GFR calc non Af Amer: 70 mL/min — ABNORMAL LOW (ref >90)
GFR, EST AFRICAN AMERICAN: 82 mL/min — AB (ref >90)
GLUCOSE: 131 mg/dL — AB (ref 70–99)
POTASSIUM: 4.3 meq/L (ref 3.7–5.3)
Sodium: 139 mEq/L (ref 137–147)
Total Bilirubin: 0.3 mg/dL (ref 0.3–1.2)
Total Protein: 6.2 g/dL (ref 6.0–8.3)

## 2014-10-08 LAB — CBC
HCT: 23.4 % — ABNORMAL LOW (ref 39.0–52.0)
Hemoglobin: 7.4 g/dL — ABNORMAL LOW (ref 13.0–17.0)
MCH: 27.2 pg (ref 26.0–34.0)
MCHC: 31.6 g/dL (ref 30.0–36.0)
MCV: 86 fL (ref 78.0–100.0)
Platelets: 323 10*3/uL (ref 150–400)
RBC: 2.72 MIL/uL — ABNORMAL LOW (ref 4.22–5.81)
RDW: 16.4 % — ABNORMAL HIGH (ref 11.5–15.5)
WBC: 5.7 10*3/uL (ref 4.0–10.5)

## 2014-10-08 LAB — URINALYSIS, ROUTINE W REFLEX MICROSCOPIC
Bilirubin Urine: NEGATIVE
GLUCOSE, UA: NEGATIVE mg/dL
Ketones, ur: NEGATIVE mg/dL
Nitrite: NEGATIVE
Protein, ur: 100 mg/dL — AB
SPECIFIC GRAVITY, URINE: 1.014 (ref 1.005–1.030)
Urobilinogen, UA: 0.2 mg/dL (ref 0.0–1.0)
pH: 6.5 (ref 5.0–8.0)

## 2014-10-08 LAB — CLOSTRIDIUM DIFFICILE BY PCR: Toxigenic C. Difficile by PCR: POSITIVE — AB

## 2014-10-08 LAB — URINE MICROSCOPIC-ADD ON

## 2014-10-08 LAB — GLUCOSE, CAPILLARY
GLUCOSE-CAPILLARY: 141 mg/dL — AB (ref 70–99)
GLUCOSE-CAPILLARY: 145 mg/dL — AB (ref 70–99)
Glucose-Capillary: 171 mg/dL — ABNORMAL HIGH (ref 70–99)
Glucose-Capillary: 217 mg/dL — ABNORMAL HIGH (ref 70–99)

## 2014-10-08 LAB — OCCULT BLOOD X 1 CARD TO LAB, STOOL: Fecal Occult Bld: NEGATIVE

## 2014-10-08 MED ORDER — VANCOMYCIN 50 MG/ML ORAL SOLUTION
125.0000 mg | Freq: Four times a day (QID) | ORAL | Status: DC
  Administered 2014-10-08 – 2014-10-10 (×9): 125 mg via ORAL
  Filled 2014-10-08 (×11): qty 2.5

## 2014-10-08 NOTE — Progress Notes (Addendum)
Pt positive for C-difficile, per lab. Enteric contact precautions in place. Dr. Sloan Leiter, S. Notified.

## 2014-10-08 NOTE — Progress Notes (Signed)
PATIENT DETAILS Name: Jacob Hodges Age: 80 y.o. Sex: male Date of Birth: [birth year] Admit Date: 10/03/2014 Admitting Physician No admitting provider for patient encounter. TWS:[email or web address], JOEL, MD  Brief narrative: 80 year old male patient with known history of prostate cancer, dyslipidemia, hypertension, and DVT. Recent admission to [facility] after traumatic subdural hematoma which was managed conservatively. During that hospitalization patient was treated for C. difficile colitis. In addition because of his history of DVT an IVC filter was placed.He was discharged to a skilled nursing facility.  At the nursing facility patient had increasing confusion and routine blood work demonstrated anemia. Patient was subsequently sent to [facility] where his hemoglobin was found to be 6.7. Because of confusion and history of recent subdural hematoma CT of the head was completed which noted worsening of his known left subdural hematoma which had increased in size from 8 mm to 18 mm with a midline shift. The Neurosurgeon at [facility] was called by the ER physician since the patient's family wanted the patient transferred to [facility]. Because of his anemia the patient was given a unit of packed red blood cells in the ER before transfer to this facility  After arrival to our facility the patient was confused and oriented to his name only. With supportive care and time, his mental status has slowly begun to improve. No interventions have been required. NS is following along with [place].   Subjective: Denies headache  Assessment/Plan: Subdural hematoma acute on chronic: Neurosurgery following,Repeat head CT 11/26 notes SDH stable at up to 2cm thickness, current management is to continue to monitor.Neurosurgery documents patient not candidate for aggressive intervention so focus should be on medical management only.Palliative Care eval pending.  Diarrhea: Per nursing staff numerous  loose stools today. Recent history of C. difficile, and now C. difficile PCR positive- will start vancomycin for 2 weeks from 11/28.  Anemia: Likely secondary to chronic disease. Mild drop in hemoglobin to 7.4, will repeat CBC in a.m., if further drop then will require PRBC transfusion. No overt evidence of blood loss.  Type 2 diabetes: CBGs stable on SSI.  History of DVT status post IVC filter placement recently: Unfortunately with SDH, not a candidate for anticoagulation currently.  Hypertension: Moderately controlled, continue with atenolol. Would avoid over aggressive correction in the setting of suspected increased ICP. Follow BP and titrate medications accordingly  ? Prostate cancer / unspecified pulmonary nodules / sclerotic bony lesions throughout axial skeleton -PSA at [facility] 11/8 was 3.2 and on 11/11 was 1.6 -11/16 patient was discharged from [facility] and appears the plan of care was for continued workup as outpatient -Of note imaging during hospitalization in November at [facility] revealed sclerotic bony lesions throughout the axial skeleton consistent with metastatic lesions and CT of the chest/abdomen/pelvis provided no primary malignancy although patient had 0.7 cm right apical and 0.4 cm right lung base pulmonary nodules; he also was found to have multiple thyroid nodules with the largest measuring 1.5 cm with recommendation to pursue dedicated thyroid ultrasound to further characterize -continued outpt work up appears to be the most appropriate  Multiple thyroid nodules -Seen on imaging at [facility] -TSH here elevated at 5.7 -Free T4 normal and T3 total essentially low normal  -Thyroid US 11/25: Very difficult study with small thyroid. Heterogeneous areas within both lobes of the thyroid are difficult to measure. There are areas of calcification with shadowing.  Bilateral lower extremity foot ulcers / recent right fifth  toe amputation for osteomyelitis October 2015 [facility]) -Patient without  leukocytosis, or fever to denote current infection, considering patient's recent C. difficile infection would hold off on empiric antibiotic coverage -Per wound care; Silicone foam to protect the heel ulcers. NS moist gauze to the open surgical amputation site. Sacral prophylactic dressing   Disposition: Remain inpatient  Antibiotics:  Anti-infectives    None      DVT Prophylaxis: SCD's  Code Status: Full code   Family Communication Tried Calling spouse at [phone] voice mail  Procedures:  None  CONSULTS:  Neurosurgery and Palliative care  Time spent 40 minutes-which includes 50% of the time with face-to-face with patient/ family and coordinating care related to the above assessment and plan.  MEDICATIONS: Scheduled Meds: . antiseptic oral rinse  7 mL Mouth Rinse q12n4p  . atenolol  12.5 mg Oral Daily  . chlorhexidine  15 mL Mouth Rinse BID  . feeding supplement (ENSURE COMPLETE)  237 mL Oral TID BM  . insulin aspart  0-9 Units Subcutaneous TID WC  . timolol  1 drop Both Eyes Daily  . Travoprost (BAK Free)  1 drop Both Eyes QHS   Continuous Infusions:  PRN Meds:.acetaminophen **OR** acetaminophen, ondansetron **OR** ondansetron (ZOFRAN) IV, oxyCODONE, traMADol    PHYSICAL EXAM: Vital signs in last 24 hours: Filed Vitals:   10/07/14 1655 10/07/14 2224 10/08/14 0219 10/08/14 0529  BP: 141/82 148/106 155/93 168/84  Pulse: 115 109 91 93  Temp: 98.2 F (36.8 C) 98.8 F (37.1 C) 98.8 F (37.1 C) 98.6 F (37 C)  TempSrc: Oral Axillary Oral Oral  Resp: 16 20 20 16   Height:      Weight:    78.2 kg (172 lb 6.4 oz)  SpO2: 100% 100% 100% 100%    Weight change: 3.7 kg (8 lb 2.5 oz) Filed Weights   10/06/14 0258 10/07/14 0500 10/08/14 0529  Weight: 72.9 kg (160 lb 11.5 oz) 74.5 kg (164 lb 3.9 oz) 78.2 kg (172 lb 6.4 oz)   Body mass index is 23.38 kg/(m^2).   Gen Exam: Awake and alert Neck: Supple, No JVD.   Chest: B/L Clear.   CVS: S1 S2  Regular, no murmurs.  Abdomen: soft, BS +, non tender, non distended.   Intake/Output from previous day:  Intake/Output Summary (Last 24 hours) at 10/08/14 1408 Last data filed at 10/08/14 0831  Gross per 24 hour  Intake    410 ml  Output    950 ml  Net   -540 ml     LAB RESULTS: CBC  Recent Labs Lab 10/03/14 2250 10/04/14 0604 10/05/14 0809 10/06/14 0240 10/08/14 0433  WBC 8.0 8.0 6.8 6.8 5.7  HGB 8.7* 8.6* 8.7* 8.4* 7.4*  HCT 26.9* 26.9* 27.1* 26.6* 23.4*  PLT 386 379 373 383 323  MCV 87.1 84.6 84.7 85.3 86.0  MCH 28.2 27.0 27.2 26.9 27.2  MCHC 32.3 32.0 32.1 31.6 31.6  RDW 16.0* 16.2* 16.1* 16.1* 16.4*  LYMPHSABS 1.0  --  1.1 0.9  --   MONOABS 0.8  --  0.8 0.5  --   EOSABS 0.1  --  0.1 0.1  --   BASOSABS 0.0  --  0.0 0.0  --     Chemistries   Recent Labs Lab 10/03/14 2250 10/04/14 0604 10/05/14 0409 10/06/14 0240 10/08/14 0433  NA 134* 135* 133* 134* 139  K 4.0 4.2 3.9 3.9 4.3  CL 98 101 98 99 104  CO2 21 19 27 21  24  GLUCOSE 140* 123* 97 231* 131*  BUN 21 20 11 22  25*  CREATININE 1.00 1.03 0.96 1.06 0.99  CALCIUM 8.5 8.7 7.4* 9.0 8.6  MG  --   --  1.9 1.9  --     CBG:  Recent Labs Lab 10/07/14 0844 10/07/14 1219 10/07/14 1705 10/08/14 0636 10/08/14 1205  GLUCAP 141* 174* 211* 141* 171*    GFR Estimated Creatinine Clearance: 55.5 mL/min (by C-G formula based on Cr of 0.99).  Coagulation profile No results for input(s): INR, PROTIME in the last 168 hours.  Cardiac Enzymes No results for input(s): CKMB, TROPONINI, MYOGLOBIN in the last 168 hours.  Invalid input(s): CK  Invalid input(s): POCBNP No results for input(s): DDIMER in the last 72 hours. No results for input(s): HGBA1C in the last 72 hours. No results for input(s): CHOL, HDL, LDLCALC, TRIG, CHOLHDL, LDLDIRECT in the last 72 hours. No results for input(s): TSH, T4TOTAL, T3FREE, THYROIDAB in the last 72 hours.  Invalid input(s): FREET3  Recent Labs  10/06/14 0240    VITAMINB12 464  FOLATE 7.3  FERRITIN 855*  TIBC 152*  IRON 12*  RETICCTPCT 1.5   No results for input(s): LIPASE, AMYLASE in the last 72 hours.  Urine Studies No results for input(s): UHGB, CRYS in the last 72 hours.  Invalid input(s): UACOL, UAPR, USPG, UPH, UTP, UGL, UKET, UBIL, UNIT, UROB, ULEU, UEPI, UWBC, URBC, UBAC, CAST, UCOM, BILUA  MICROBIOLOGY: Recent Results (from the past 240 hour(s))  MRSA PCR Screening     Status: Abnormal   Collection Time: 10/03/14  8:57 PM  Result Value Ref Range Status   MRSA by PCR POSITIVE (A) NEGATIVE Final    Comment:        The GeneXpert MRSA Assay (FDA approved for NASAL specimens only), is one component of a comprehensive MRSA colonization surveillance program. It is not intended to diagnose MRSA infection nor to guide or monitor treatment for MRSA infections. RESULT CALLED TO, READ BACK BY AND VERIFIED WITH: USSERY,K RN [phone] 10/04/14 MITCHELL,L   Clostridium Difficile by PCR     Status: Abnormal   Collection Time: 10/08/14 11:34 AM  Result Value Ref Range Status   C difficile by pcr POSITIVE (A) NEGATIVE Final    Comment: CRITICAL RESULT CALLED TO, READ BACK BY AND VERIFIED WITH: A.TEPE,RN 1326 10/08/14 M.CAMPBELL     RADIOLOGY STUDIES/RESULTS: Ct Head Wo Contrast  10/06/2014   CLINICAL DATA:  80 year old male with traumatic left subdural hematoma, being managed conservatively. Initial encounter.  EXAM: CT HEAD WITHOUT CONTRAST  TECHNIQUE: Contiguous axial images were obtained from the base of the skull through the vertex without intravenous contrast.  COMPARISON:  10/03/2014 and earlier.  FINDINGS: No acute osseous abnormality identified. Visualized paranasal sinuses and mastoids are clear. No acute orbit or scalp soft tissue findings. Extensive Calcified atherosclerosis at the skull base.  Mixed density left side peripheral and parafalcine hematoma re- identified. Bulky parafalcine component at the level of the centrum  semiovale has not significantly changed measuring up to 19-20 mm in maximal thickness. Peripheral component with mass effect most pronounced on the left anterior frontal lobe also is stable measuring 18-19 mm in thickness. Rightward midline shift of 9 mm is stable.  Stable effacement of the left lateral ventricle. Small volume intraventricular hemorrhage re- identified layering in the right occipital horn. Stable mild enlargement of the right lateral ventricle. Stable basilar cisterns. Stable gray-white matter differentiation. No acute cortically based infarct identified. No new areas of intracranial  hemorrhage identified.  IMPRESSION: 1. Stable left hemisphere parafalcine and peripheral mixed density subdural hematoma up to 2 cm in thickness. 2. Stable mass effect on the left hemisphere with rightward midline shift of 9 mm, and effaced left lateral ventricle. Stable mildly enlarged right lateral ventricle and trace intraventricular hemorrhage. 3. No new intracranial abnormality identified.   Electronically Signed   By: Lars Pinks M.D.   On: 10/06/2014 10:54   US Soft Tissue Head/neck  10/05/2014   CLINICAL DATA:  Thyroid nodules seen on prior chest CT.  EXAM: THYROID ULTRASOUND  TECHNIQUE: Ultrasound examination of the thyroid gland and adjacent soft tissues was performed.  COMPARISON:  None.  FINDINGS: Right thyroid lobe  Measurements: 2.0 x 0.4 x 0.7 cm. The thyroid appears very small. There appear to be echogenic shadowing areas within the right thyroid lobe, difficult to measure as discrete nodules.  Left thyroid lobe  Measurements: 1.7 x 0.4 x 0.7 cm. Very small thyroid. Heterogeneous areas within the thyroid with scattered hyperechoic and calcified/shadowing areas. The largest measurable focal hyperechoic area in the midpole measures 7 mm.  Isthmus  Thickness: 1.5 mm.  No nodules visualized.  Lymphadenopathy  None visualized.  IMPRESSION: Very difficult study with small thyroid. Heterogeneous areas within  both lobes of the thyroid are difficult to measure. There are areas of calcification with shadowing.   Electronically Signed   By: Rolm Baptise M.D.   On: 10/05/2014 19:54   Dg Chest Port 1 View  10/04/2014   CLINICAL DATA:  Dysphasia.  EXAM: PORTABLE CHEST - 1 VIEW  COMPARISON:  09/17/2014.  FINDINGS: Trachea is midline. Heart size stable. Lungs are low in volume with central pulmonary vascular congestion. A small nodular density projects over the left sixth posterior rib. Additional nodular densities seen on the prior exam are not readily appreciated. No definite pleural fluid.  IMPRESSION: 1. Low lung volumes with central pulmonary vascular congestion. 2. Bilateral pulmonary nodular densities are better seen on the prior exam.   Electronically Signed   By: Lorin Picket M.D.   On: 10/04/2014 07:45    Oren Binet, MD  [facility] Pager:[phone]  If 7PM-7AM, please contact night-coverage [email or web address] Password [email or web address] 10/08/2014, 2:08 PM   LOS: 5 days

## 2014-10-08 NOTE — Evaluation (Signed)
Occupational Therapy Evaluation and Discharge Patient Details Name: Jacob Hodges MRN: [id number] DOB: 14-Oct-1923 Today's Date: 10/08/2014    History of Present Illness Pt adm with worsening lt SDH. Pt with recent adm at [facility] for traumatic lt SDH after fall. Pt was at SNF but had worsening confusion and now adm with worsening SDH. Pt also with c-diff and IVC filter. 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 PMH - Prostate CA.   Clinical Impression   This 80 yo male admitted with above presents to acute OT with decreased balance, decreased mobility, contractures RLE, decreased safety awareness, decreased following commands all affecting his ability to do anything for himself and thus increasing burden of care on caregivers. He will benefit from a trial of OT at SNF. Acute OT will sign off.    Follow Up Recommendations  SNF    Equipment Recommendations   (TBD at next venue)       Precautions / Restrictions Precautions Precautions: Fall      Mobility Bed Mobility Overal bed mobility: Needs Assistance Bed Mobility: Supine to Sit     Supine to sit: Total assist;HOB elevated Sit to supine: Total assist   General bed mobility comments: Same as PT yesterday--Assist to bring legs off bed and to elevate trunk into sitting. Assist to control descent of trunk and to bring feet back up into bed.     Balance Overall balance assessment: Needs assistance Sitting-balance support: Feet supported;Single extremity supported Sitting balance-Leahy Scale: Zero Sitting balance - Comments: Pt sat EOB x 5 minutes with total A due to strongly pushing to his right. However if I made him come down on his left forearm he could maintain his balance at a min guard A level, but immediately went back to total A when he sat back up.  Postural control: Right lateral lean                                  ADL Overall ADL's : Needs assistance/impaired                                       General ADL Comments: At this time pt is total A for all BADLs               Pertinent Vitals/Pain Pain Assessment: Faces Faces Pain Scale: No hurt        Extremity/Trunk Assessment Upper Extremity Assessment Upper Extremity Assessment: Generalized weakness              Cognition Arousal/Alertness: Awake/alert Behavior During Therapy: WFL for tasks assessed/performed Overall Cognitive Status: No family/caregiver present to determine baseline cognitive functioning Area of Impairment: Following commands;Safety/judgement       Following Commands: Follows one step commands inconsistently Safety/Judgement: Decreased awareness of safety;Decreased awareness of deficits     General Comments: Pt pushing strongly to his right in sitting              Home Living Family/patient expects to be discharged to:: [place] facility                                        Prior Functioning/Environment  Comments: Unsure of status prior to fall and SDH.    OT Diagnosis: Generalized weakness;Cognitive deficits   OT Problem List: Decreased range of motion;Decreased activity tolerance;Impaired balance (sitting and/or standing);Decreased cognition;Decreased safety awareness;Decreased knowledge of use of DME or AE      OT Goals(Current goals can be found in the care plan section) Acute Rehab OT Goals Patient Stated Goal: pt unable to state  OT Frequency:                End of Session Nurse Communication:  (Called front desk to let them know pt needed cleaned up due to a bowel movement)  Activity Tolerance: Patient limited by fatigue Patient left: in bed;with call bell/phone within reach;with bed alarm set   Time: [phone] OT Time Calculation (min): 12 min Charges:  OT General Charges $OT Visit: 1 Procedure OT Evaluation $Initial OT Evaluation Tier I: 1 Procedure OT Treatments $Therapeutic  Activity: 8-22 mins  Almon Register [phone] 10/08/2014, 9:47 AM

## 2014-10-08 NOTE — Progress Notes (Signed)
Overall stable. No new issues or problems. Continue current management. 

## 2014-10-09 DIAGNOSIS — A0472 Enterocolitis due to Clostridium difficile, not specified as recurrent: Secondary | ICD-10-CM

## 2014-10-09 DIAGNOSIS — N390 Urinary tract infection, site not specified: Secondary | ICD-10-CM

## 2014-10-09 LAB — CBC
HEMATOCRIT: 23.7 % — AB (ref 39.0–52.0)
HEMOGLOBIN: 7.4 g/dL — AB (ref 13.0–17.0)
MCH: 26.8 pg (ref 26.0–34.0)
MCHC: 31.2 g/dL (ref 30.0–36.0)
MCV: 85.9 fL (ref 78.0–100.0)
Platelets: 316 10*3/uL (ref 150–400)
RBC: 2.76 MIL/uL — AB (ref 4.22–5.81)
RDW: 16.3 % — ABNORMAL HIGH (ref 11.5–15.5)
WBC: 5.6 10*3/uL (ref 4.0–10.5)

## 2014-10-09 LAB — PREPARE RBC (CROSSMATCH)

## 2014-10-09 LAB — GLUCOSE, CAPILLARY
GLUCOSE-CAPILLARY: 133 mg/dL — AB (ref 70–99)
Glucose-Capillary: 133 mg/dL — ABNORMAL HIGH (ref 70–99)
Glucose-Capillary: 251 mg/dL — ABNORMAL HIGH (ref 70–99)
Glucose-Capillary: 127 mg/dL — ABNORMAL HIGH (ref 70–99)

## 2014-10-09 LAB — BASIC METABOLIC PANEL
Anion gap: 14 (ref 5–15)
BUN: 24 mg/dL — ABNORMAL HIGH (ref 6–23)
CHLORIDE: 101 meq/L (ref 96–112)
CO2: 22 mEq/L (ref 19–32)
Calcium: 8.5 mg/dL (ref 8.4–10.5)
Creatinine, Ser: 0.98 mg/dL (ref 0.50–1.35)
GFR calc Af Amer: 82 mL/min — ABNORMAL LOW (ref >90)
GFR calc non Af Amer: 71 mL/min — ABNORMAL LOW (ref >90)
GLUCOSE: 128 mg/dL — AB (ref 70–99)
POTASSIUM: 4.1 meq/L (ref 3.7–5.3)
Sodium: 137 mEq/L (ref 137–147)

## 2014-10-09 MED ORDER — SODIUM CHLORIDE 0.9 % IV SOLN
Freq: Once | INTRAVENOUS | Status: AC
  Administered 2014-10-09: 12:00:00 via INTRAVENOUS

## 2014-10-09 MED ORDER — CEFTRIAXONE SODIUM IN DEXTROSE 20 MG/ML IV SOLN
1.0000 g | Freq: Every day | INTRAVENOUS | Status: DC
  Administered 2014-10-09 (×2): 1 g via INTRAVENOUS
  Filled 2014-10-09 (×3): qty 50

## 2014-10-09 NOTE — Progress Notes (Signed)
Blood administration completed.

## 2014-10-09 NOTE — Progress Notes (Signed)
Blood administration started at 1440

## 2014-10-09 NOTE — Progress Notes (Signed)
No change in status. No new recommendations. Continue efforts at supportive care.

## 2014-10-09 NOTE — Progress Notes (Signed)
PATIENT DETAILS Name: Jacob Hodges Age: 80 y.o. Sex: male Date of Birth: 12-Jun-1923 Admit Date: 10/03/2014 Admitting Physician No admitting provider for patient encounter. GGY:IRSWNIO, JOEL, MD  Brief narrative: 80 year old male patient with known history of prostate cancer, dyslipidemia, hypertension, and DVT. Recent admission to [facility] hospital after traumatic subdural hematoma which was managed conservatively. During that hospitalization patient was treated for C. difficile colitis. In addition because of his history of DVT an IVC filter was placed.He was discharged to a skilled nursing facility.  At the nursing facility patient had increasing confusion and routine blood work demonstrated anemia. Patient was subsequently sent to [facility] where his hemoglobin was found to be 6.7. Because of confusion and history of recent subdural hematoma CT of the head was completed which noted worsening of his known left subdural hematoma which had increased in size from 8 mm to 18 mm with a midline shift. The Neurosurgeon at [facility] was called by the ER physician since the patient's family wanted the patient transferred to [facility]. Because of his anemia the patient was given a unit of packed red blood cells in the ER before transfer to this facility  After arrival to our facility the patient was confused and oriented to his name only. With supportive care and time, his mental status has slowly begun to improve. No interventions have been required. NS is following along with [place].   Subjective: Patient is was awake, alert, can follow a few simple commands for me. Nursing staff reporting one partially formed bowel movement overnight.   Assessment/Plan: Subdural hematoma acute on chronic:  -Neurosurgery following, not candidate for surgical intervention -Remains neurologically stable, repeat CT scan of brain performed on 10/06/2014 showing stable left hemisphere subdural  hematoma with associated stable mass effect -Continue supportive care  Diarrhea: Per nursing staff numerous loose stools today. Recent history of C. difficile,  -C. difficile PCR positive started on oral vancomycin on 10/08/2014 -Also being treated for urinary tract infection with ceftriaxone  Anemia: Likely secondary to chronic disease.  -Hemoglobin remains 7.4, will transfuse 1 unit of packed red blood cells.  Urinary tract infection -Urinalysis obtained on 10/08/2014 showing the presence of many bacteria with large amount of leukocyte Estrace -He was started on ceftriaxone. Unfortunately he is also currently being treated for C. difficile colitis, will monitor closely  Type 2 diabetes: CBGs stable on SSI.  History of DVT status post IVC filter placement recently: Unfortunately with SDH, not a candidate for anticoagulation currently.  Hypertension: Moderately controlled, continue with atenolol. Would avoid over aggressive correction in the setting of suspected increased ICP. Follow BP and titrate medications accordingly  ? Prostate cancer / unspecified pulmonary nodules / sclerotic bony lesions throughout axial skeleton -PSA at [facility] 11/8 was 3.2 and on 11/11 was 1.6 -11/16 patient was discharged from [facility] and appears the plan of care was for continued workup as outpatient -Of note imaging during hospitalization in November at [facility] revealed sclerotic bony lesions throughout the axial skeleton consistent with metastatic lesions and CT of the chest/abdomen/pelvis provided no primary malignancy although patient had 0.7 cm right apical and 0.4 cm right lung base pulmonary nodules; he also was found to have multiple thyroid nodules with the largest measuring 1.5 cm with recommendation to pursue dedicated thyroid ultrasound to further characterize -continued outpt work up appears to be the most appropriate  Multiple thyroid nodules -Seen on imaging at [facility] -TSH here elevated at  5.7 -Free  T4 normal and T3 total essentially low normal  -Thyroid US 11/25: Very difficult study with small thyroid. Heterogeneous areas within both lobes of the thyroid are difficult to measure. There are areas of calcification with shadowing.  Bilateral lower extremity foot ulcers / recent right fifth toe amputation for osteomyelitis October 2015 [facility]) -Patient without leukocytosis, or fever to denote current infection, considering patient's recent C. difficile infection would hold off on empiric antibiotic coverage -Per wound care; Silicone foam to protect the heel ulcers. NS moist gauze to the open surgical amputation site. Sacral prophylactic dressing   Disposition: Likely discharge to skilled nursing facility when stable  Antibiotics: Vancomycin (started on 10/08/2014) Ceftriaxone (started on 09/30/2014)  Anti-infectives    Start     Dose/Rate Route Frequency Ordered Stop   10/09/14 0100  cefTRIAXone (ROCEPHIN) 1 g in dextrose 5 % 50 mL IVPB - Premix     1 g100 mL/hr over 30 Minutes Intravenous Daily at bedtime 10/09/14 0017     10/08/14 1430  vancomycin (VANCOCIN) 50 mg/mL oral solution 125 mg     125 mg Oral 4 times daily 10/08/14 1424 10/22/14 1359      DVT Prophylaxis: SCD's  Code Status: Full code   Family Communication Tried Calling spouse at [phone] voice mail  Procedures:  None  CONSULTS:  Neurosurgery and Palliative care  Time spent 30 minutes  MEDICATIONS: Scheduled Meds: . antiseptic oral rinse  7 mL Mouth Rinse q12n4p  . atenolol  12.5 mg Oral Daily  . cefTRIAXone (ROCEPHIN)  IV  1 g Intravenous QHS  . chlorhexidine  15 mL Mouth Rinse BID  . feeding supplement (ENSURE COMPLETE)  237 mL Oral TID BM  . insulin aspart  0-9 Units Subcutaneous TID WC  . timolol  1 drop Both Eyes Daily  . Travoprost (BAK Free)  1 drop Both Eyes QHS  . vancomycin  125 mg Oral QID   Continuous Infusions:  PRN Meds:.acetaminophen **OR** acetaminophen, ondansetron  **OR** ondansetron (ZOFRAN) IV, oxyCODONE, traMADol    PHYSICAL EXAM: Vital signs in last 24 hours: Filed Vitals:   10/08/14 2132 10/09/14 0211 10/09/14 0500 10/09/14 0641  BP: 156/80 163/73  169/90  Pulse: 111 85  108  Temp: 99.5 F (37.5 C) 98.6 F (37 C)  98 F (36.7 C)  TempSrc: Oral Axillary  Oral  Resp: 22 20  20   Height:      Weight:   76.4 kg (168 lb 6.9 oz)   SpO2: 100% 100%  100%    Weight change: -1.8 kg (-3 lb 15.5 oz) Filed Weights   10/07/14 0500 10/08/14 0529 10/09/14 0500  Weight: 74.5 kg (164 lb 3.9 oz) 78.2 kg (172 lb 6.4 oz) 76.4 kg (168 lb 6.9 oz)   Body mass index is 22.84 kg/(m^2).   Gen Exam: Awake and alert Neck: Supple, No JVD.   Chest: B/L Clear.   CVS: S1 S2 Regular, no murmurs.  Abdomen: soft, BS +, non tender, non distended.   Intake/Output from previous day:  Intake/Output Summary (Last 24 hours) at 10/09/14 1040 Last data filed at 10/09/14 0645  Gross per 24 hour  Intake      0 ml  Output    850 ml  Net   -850 ml     LAB RESULTS: CBC  Recent Labs Lab 10/03/14 2250 10/04/14 0604 10/05/14 0809 10/06/14 0240 10/08/14 0433 10/09/14 0258  WBC 8.0 8.0 6.8 6.8 5.7 5.6  HGB 8.7* 8.6* 8.7*  8.4* 7.4* 7.4*  HCT 26.9* 26.9* 27.1* 26.6* 23.4* 23.7*  PLT 386 379 373 383 323 316  MCV 87.1 84.6 84.7 85.3 86.0 85.9  MCH 28.2 27.0 27.2 26.9 27.2 26.8  MCHC 32.3 32.0 32.1 31.6 31.6 31.2  RDW 16.0* 16.2* 16.1* 16.1* 16.4* 16.3*  LYMPHSABS 1.0  --  1.1 0.9  --   --   MONOABS 0.8  --  0.8 0.5  --   --   EOSABS 0.1  --  0.1 0.1  --   --   BASOSABS 0.0  --  0.0 0.0  --   --     Chemistries   Recent Labs Lab 10/04/14 0604 10/05/14 0409 10/06/14 0240 10/08/14 0433 10/09/14 0258  NA 135* 133* 134* 139 137  K 4.2 3.9 3.9 4.3 4.1  CL 101 98 99 104 101  CO2 19 27 21 24 22   GLUCOSE 123* 97 231* 131* 128*  BUN 20 11 22  25* 24*  CREATININE 1.03 0.96 1.06 0.99 0.98  CALCIUM 8.7 7.4* 9.0 8.6 8.5  MG  --  1.9 1.9  --   --      CBG:  Recent Labs Lab 10/08/14 0636 10/08/14 1205 10/08/14 1646 10/08/14 2201 10/09/14 0626  GLUCAP 141* 171* 145* 217* 133*    GFR Estimated Creatinine Clearance: 55.2 mL/min (by C-G formula based on Cr of 0.98).  Coagulation profile No results for input(s): INR, PROTIME in the last 168 hours.  Cardiac Enzymes No results for input(s): CKMB, TROPONINI, MYOGLOBIN in the last 168 hours.  Invalid input(s): CK  Invalid input(s): POCBNP No results for input(s): DDIMER in the last 72 hours. No results for input(s): HGBA1C in the last 72 hours. No results for input(s): CHOL, HDL, LDLCALC, TRIG, CHOLHDL, LDLDIRECT in the last 72 hours. No results for input(s): TSH, T4TOTAL, T3FREE, THYROIDAB in the last 72 hours.  Invalid input(s): FREET3 No results for input(s): VITAMINB12, FOLATE, FERRITIN, TIBC, IRON, RETICCTPCT in the last 72 hours. No results for input(s): LIPASE, AMYLASE in the last 72 hours.  Urine Studies No results for input(s): UHGB, CRYS in the last 72 hours.  Invalid input(s): UACOL, UAPR, USPG, UPH, UTP, UGL, UKET, UBIL, UNIT, UROB, ULEU, UEPI, UWBC, URBC, UBAC, CAST, UCOM, BILUA  MICROBIOLOGY: Recent Results (from the past 240 hour(s))  MRSA PCR Screening     Status: Abnormal   Collection Time: 10/03/14  8:57 PM  Result Value Ref Range Status   MRSA by PCR POSITIVE (A) NEGATIVE Final    Comment:        The GeneXpert MRSA Assay (FDA approved for NASAL specimens only), is one component of a comprehensive MRSA colonization surveillance program. It is not intended to diagnose MRSA infection nor to guide or monitor treatment for MRSA infections. RESULT CALLED TO, READ BACK BY AND VERIFIED WITH: USSERY,K RN [id number] 10/04/14 MITCHELL,L   Clostridium Difficile by PCR     Status: Abnormal   Collection Time: 10/08/14 11:34 AM  Result Value Ref Range Status   C difficile by pcr POSITIVE (A) NEGATIVE Final    Comment: CRITICAL RESULT CALLED TO, READ BACK BY  AND VERIFIED WITH: A.TEPE,RN 1326 10/08/14 M.CAMPBELL     RADIOLOGY STUDIES/RESULTS: Ct Head Wo Contrast  10/06/2014   CLINICAL DATA:  80 year old male with traumatic left subdural hematoma, being managed conservatively. Initial encounter.  EXAM: CT HEAD WITHOUT CONTRAST  TECHNIQUE: Contiguous axial images were obtained from the base of the skull through the vertex without intravenous contrast.  COMPARISON:  10/03/2014 and earlier.  FINDINGS: No acute osseous abnormality identified. Visualized paranasal sinuses and mastoids are clear. No acute orbit or scalp soft tissue findings. Extensive Calcified atherosclerosis at the skull base.  Mixed density left side peripheral and parafalcine hematoma re- identified. Bulky parafalcine component at the level of the centrum semiovale has not significantly changed measuring up to 19-20 mm in maximal thickness. Peripheral component with mass effect most pronounced on the left anterior frontal lobe also is stable measuring 18-19 mm in thickness. Rightward midline shift of 9 mm is stable.  Stable effacement of the left lateral ventricle. Small volume intraventricular hemorrhage re- identified layering in the right occipital horn. Stable mild enlargement of the right lateral ventricle. Stable basilar cisterns. Stable gray-white matter differentiation. No acute cortically based infarct identified. No new areas of intracranial hemorrhage identified.  IMPRESSION: 1. Stable left hemisphere parafalcine and peripheral mixed density subdural hematoma up to 2 cm in thickness. 2. Stable mass effect on the left hemisphere with rightward midline shift of 9 mm, and effaced left lateral ventricle. Stable mildly enlarged right lateral ventricle and trace intraventricular hemorrhage. 3. No new intracranial abnormality identified.   Electronically Signed   By: Lars Pinks M.D.   On: 10/06/2014 10:54   US Soft Tissue Head/neck  10/05/2014   CLINICAL DATA:  Thyroid nodules seen on prior  chest CT.  EXAM: THYROID ULTRASOUND  TECHNIQUE: Ultrasound examination of the thyroid gland and adjacent soft tissues was performed.  COMPARISON:  None.  FINDINGS: Right thyroid lobe  Measurements: 2.0 x 0.4 x 0.7 cm. The thyroid appears very small. There appear to be echogenic shadowing areas within the right thyroid lobe, difficult to measure as discrete nodules.  Left thyroid lobe  Measurements: 1.7 x 0.4 x 0.7 cm. Very small thyroid. Heterogeneous areas within the thyroid with scattered hyperechoic and calcified/shadowing areas. The largest measurable focal hyperechoic area in the midpole measures 7 mm.  Isthmus  Thickness: 1.5 mm.  No nodules visualized.  Lymphadenopathy  None visualized.  IMPRESSION: Very difficult study with small thyroid. Heterogeneous areas within both lobes of the thyroid are difficult to measure. There are areas of calcification with shadowing.   Electronically Signed   By: Rolm Baptise M.D.   On: 10/05/2014 19:54   Dg Chest Port 1 View  10/04/2014   CLINICAL DATA:  Dysphasia.  EXAM: PORTABLE CHEST - 1 VIEW  COMPARISON:  09/17/2014.  FINDINGS: Trachea is midline. Heart size stable. Lungs are low in volume with central pulmonary vascular congestion. A small nodular density projects over the left sixth posterior rib. Additional nodular densities seen on the prior exam are not readily appreciated. No definite pleural fluid.  IMPRESSION: 1. Low lung volumes with central pulmonary vascular congestion. 2. Bilateral pulmonary nodular densities are better seen on the prior exam.   Electronically Signed   By: Lorin Picket M.D.   On: 10/04/2014 07:45    Kelvin Cellar, MD  [facility] Pager:[phone]  If 7PM-7AM, please contact night-coverage [email or web address] Password [email or web address] 10/09/2014, 10:40 AM   LOS: 6 days

## 2014-10-09 NOTE — Progress Notes (Signed)
Called patient's family to obtain blood consent as patient cannot sign. Message left to call writer back.

## 2014-10-10 DIAGNOSIS — A047 Enterocolitis due to Clostridium difficile: Secondary | ICD-10-CM

## 2014-10-10 DIAGNOSIS — S065X9A Traumatic subdural hemorrhage with loss of consciousness of unspecified duration, initial encounter: Secondary | ICD-10-CM | POA: Insufficient documentation

## 2014-10-10 DIAGNOSIS — S065XAA Traumatic subdural hemorrhage with loss of consciousness status unknown, initial encounter: Secondary | ICD-10-CM | POA: Insufficient documentation

## 2014-10-10 DIAGNOSIS — N3000 Acute cystitis without hematuria: Secondary | ICD-10-CM

## 2014-10-10 LAB — GLUCOSE, CAPILLARY
GLUCOSE-CAPILLARY: 164 mg/dL — AB (ref 70–99)
GLUCOSE-CAPILLARY: 94 mg/dL (ref 70–99)
Glucose-Capillary: 143 mg/dL — ABNORMAL HIGH (ref 70–99)

## 2014-10-10 LAB — TYPE AND SCREEN
ABO/RH(D): A POS
Antibody Screen: NEGATIVE
Unit division: 0

## 2014-10-10 LAB — CBC
HEMATOCRIT: 29.9 % — AB (ref 39.0–52.0)
HEMOGLOBIN: 9.6 g/dL — AB (ref 13.0–17.0)
MCH: 28.1 pg (ref 26.0–34.0)
MCHC: 32.1 g/dL (ref 30.0–36.0)
MCV: 87.4 fL (ref 78.0–100.0)
Platelets: 295 10*3/uL (ref 150–400)
RBC: 3.42 MIL/uL — ABNORMAL LOW (ref 4.22–5.81)
RDW: 15.7 % — ABNORMAL HIGH (ref 11.5–15.5)
WBC: 7 10*3/uL (ref 4.0–10.5)

## 2014-10-10 LAB — URINE CULTURE

## 2014-10-10 MED ORDER — OXYCODONE-ACETAMINOPHEN 10-325 MG PO TABS: 1.0000 | ORAL_TABLET | Freq: Three times a day (TID) | ORAL | Status: DC | PRN

## 2014-10-10 MED ORDER — AMLODIPINE BESYLATE 10 MG PO TABS
10.0000 mg | ORAL_TABLET | Freq: Every day | ORAL | Status: DC
  Administered 2014-10-10: 10 mg via ORAL
  Filled 2014-10-10: qty 1

## 2014-10-10 MED ORDER — VANCOMYCIN 50 MG/ML ORAL SOLUTION: 125.0000 mg | Freq: Four times a day (QID) | ORAL | Status: DC

## 2014-10-10 MED ORDER — ENSURE COMPLETE PO LIQD: 237.0000 mL | Freq: Three times a day (TID) | ORAL | Status: DC

## 2014-10-10 MED ORDER — MUPIROCIN CALCIUM 2 % EX CREA
TOPICAL_CREAM | Freq: Two times a day (BID) | CUTANEOUS | Status: DC
  Filled 2014-10-10: qty 15

## 2014-10-10 MED ORDER — CIPROFLOXACIN HCL 500 MG PO TABS
500.0000 mg | ORAL_TABLET | Freq: Two times a day (BID) | ORAL | Status: DC
Start: 2014-10-10 — End: 2023-10-07

## 2014-10-10 NOTE — Progress Notes (Signed)
Report called to [employer]. All questions answered.

## 2014-10-10 NOTE — Progress Notes (Signed)
Discharge orders received. Pt for discharge home today. IV d/c'd. Pt given discharge instructions with verbalized understanding. Family aware that pt is being transported to skilled nursing facility. Pt transported to facility via [place]. Will call report to facility.

## 2014-10-10 NOTE — Clinical Social Work Note (Signed)
CSW spoke with the pt's wife May and niece Carolin.  CSW informed the family that the pt will be discharge [facility] today. CSW and family discussed ambulance transport.  CSW contact [facility] at [phone] to schedule transport for the pt. CSW will upload the pt's discharge summary to [facility]. Bedside RN can call reported to [phone].   [place], MSW, [place]

## 2014-10-10 NOTE — Discharge Summary (Signed)
Physician Discharge Summary  Jacob Hodges IRS:[id number] DOB: 10-26-1923 DOA: 10/03/2014  PCP: Debbora Dus, MD  Admit date: 10/03/2014 Discharge date: 10/10/2014  Time spent: 35 minutes  Recommendations for Outpatient Follow-up:  1. Please follow up on blood pressures 2. Repeat cbc in 2-3, he was anemic during this hospitalization and required blood transfusions 3. He was diagnosed with Cdiff colitis during this hospitalization, will discharge on a 2 week course of oral Vancomycin, anticipated stop date October 22, 2014 4. He was diagnosed with UTI, was discharged on Cipro 500 mg PO BID x 4 days  Discharge Diagnoses:  Principal Problem:   Acute on chronic intracranial subdural hematoma Active Problems:   Subdural hematoma   Severe anemia   Essential hypertension   Prostate cancer   History of DVT of lower extremity   Hyperlipidemia   DVT (deep venous thrombosis)   Prostate cancer metastatic to bone   Skin ulcers of foot, bilateral   Protein-calorie malnutrition, severe   Dysphagia   Sick-euthyroid syndrome   Enteritis due to Clostridium difficile   UTI (urinary tract infection)   SDH (subdural hematoma)   Discharge Condition: Stable  Diet recommendation: Dysphagia 3 diet with thin fluids  Filed Weights   10/08/14 0529 10/09/14 0500 10/10/14 0500  Weight: 78.2 kg (172 lb 6.4 oz) 76.4 kg (168 lb 6.9 oz) 76.6 kg (168 lb 14 oz)    History of present illness:  Jacob Hodges is a 80 y.o. male who was recently admitted to [facility] after patient had a traumatic subdural hematoma and was managed conservatively. During the course patient also was found to have C. difficile colitis and also had undergone IVC filter placement has history of DVT. Patient also was found to have multiple lung nodules for which further workup was planned as outpatient per oncologist. Patient was eventually discharged to nursing home following which patient was found to have increasing  confusion and blood work showed worsening anemia. Patient was transferred to [facility]. Hemoglobin was found to be around 6.7. Since patient was found to be confused CT head was done which showed worsening of patient's known left subdural hematoma increasing size from 8 mm to 18 mm with midline shift. On-call neurosurgeon at [facility] by the ER physician as patient's family wanted lesion to be transferred to [facility]. Patient also had received 1 unit PRBC in the ER for the anemia. On exam patient appears confused and is oriented to his name only. Patient has known history of being weak on the right side.   Labs done at [facility] center showed sodium of 134 potassium 4.2 chloride of 103 bicarbonate of 53 BUN of 20 creatinine 1.1 glucose of 142 calcium of 8.1 hemoglobin of 6.7 platelets around 285.  Hospital Course:  Subdural hematoma acute on chronic:  -Neurosurgery following, not candidate for surgical intervention -Remains neurologically stable, repeat CT scan of brain performed on 10/06/2014 showing stable left hemisphere subdural hematoma with associated stable mass effect -Patient seen by Dr Trenton Gammon of [facility], patient not having change in status, NS having no recommendations.  -Continue supportive care.   Diarrhea: -C. difficile PCR positive started on oral vancomycin on 10/08/2014 -Also being treated for urinary tract infection with ceftriaxone -Anticipated stop date 10/22/2014  Anemia: Likely secondary to chronic disease.  -Patient was transfused on 10/09/2014 with 1 unit of PRBC's, repeat Hg 9.6 on 10/10/2014  Urinary tract infection -Urinalysis obtained on 10/08/2014 showing the presence of many bacteria  with large amount of leukocyte Estrace -He was started on ceftriaxone. Unfortunately he is also currently being treated for C. difficile colitis, will monitor closely -Will discharge on Cipro 500 mg PO BID x 4 days  Type 2 diabetes: CBGs stable on  SSI.  History of DVT status post IVC filter placement recently: Unfortunately with SDH, not a candidate for anticoagulation currently.  Hypertension: Moderately controlled, continue with atenolol. Would avoid over aggressive correction in the setting of suspected increased ICP. Follow BP and titrate medications accordingly  ? Prostate cancer / unspecified pulmonary nodules / sclerotic bony lesions throughout axial skeleton -PSA at [facility] 11/8 was 3.2 and on 11/11 was 1.6 -11/16 patient was discharged from [facility] and appears the plan of care was for continued workup as outpatient -Of note imaging during hospitalization in November at [facility] revealed sclerotic bony lesions throughout the axial skeleton consistent with metastatic lesions and CT of the chest/abdomen/pelvis provided no primary malignancy although patient had 0.7 cm right apical and 0.4 cm right lung base pulmonary nodules; he also was found to have multiple thyroid nodules with the largest measuring 1.5 cm with recommendation to pursue dedicated thyroid ultrasound to further characterize -continued outpt work up appears to be the most appropriate  Multiple thyroid nodules -Seen on imaging at [facility] -TSH here elevated at 5.7 -Free T4 normal and T3 total essentially low normal  -Thyroid US 11/25: Very difficult study with small thyroid. Heterogeneous areas within both lobes of the thyroid are difficult to measure. There are areas of calcification with shadowing.  Bilateral lower extremity foot ulcers / recent right fifth toe amputation for osteomyelitis October 2015 [facility]) -Patient without leukocytosis, or fever to denote current infection, considering patient's recent C. difficile infection would hold off on empiric antibiotic coverage -Per wound care; Silicone foam to protect the heel ulcers. NS moist gauze to the open surgical amputation site. Sacral prophylactic dressing   Consultations:  Neurosurgery  Discharge Exam: Filed  Vitals:   10/10/14 1100  BP: 177/92  Pulse: 79  Temp: 98.3 F (36.8 C)  Resp: 18   Gen Exam: Awake and alert Neck: Supple, No JVD.  Chest: B/L Clear.  CVS: S1 S2 Regular, no murmurs.  Abdomen: soft, BS +, non tender, non distended.   Discharge Instructions You were cared for by a hospitalist during your hospital stay. If you have any questions about your discharge medications or the care you received while you were in the hospital after you are discharged, you can call the unit and asked to speak with the hospitalist on call if the hospitalist that took care of you is not available. Once you are discharged, your primary care physician will handle any further medical issues. Please note that NO REFILLS for any discharge medications will be authorized once you are discharged, as it is imperative that you return to your primary care physician (or establish a relationship with a primary care physician if you do not have one) for your aftercare needs so that they can reassess your need for medications and monitor your lab values.  Discharge Instructions    Call MD for:  difficulty breathing, headache or visual disturbances    Complete by:  As directed      Call MD for:  extreme fatigue    Complete by:  As directed      Call MD for:  hives    Complete by:  As directed      Call MD for:  persistant dizziness or  light-headedness    Complete by:  As directed      Call MD for:  persistant nausea and vomiting    Complete by:  As directed      Call MD for:  redness, tenderness, or signs of infection (pain, swelling, redness, odor or green/yellow discharge around incision site)    Complete by:  As directed      Call MD for:  severe uncontrolled pain    Complete by:  As directed      Call MD for:  temperature >100.4    Complete by:  As directed      Diet - low sodium heart healthy    Complete by:  As directed      Increase activity slowly    Complete by:  As directed           Current  Discharge Medication List    START taking these medications   Details  feeding supplement, ENSURE COMPLETE, (ENSURE COMPLETE) LIQD Take 237 mLs by mouth 3 (three) times daily between meals. Qty: 20 Bottle, Refills: 0    vancomycin (VANCOCIN) 50 mg/mL oral solution Take 2.5 mLs (125 mg total) by mouth 4 (four) times daily. Qty: 1 mL, Refills: 0      CONTINUE these medications which have CHANGED   Details  ciprofloxacin (CIPRO) 500 MG tablet Take 1 tablet (500 mg total) by mouth 2 (two) times daily. Qty: 10 tablet, Refills: 0    oxyCODONE-acetaminophen (PERCOCET) 10-325 MG per tablet Take 1 tablet by mouth every 8 (eight) hours as needed for pain. Qty: 15 tablet, Refills: 0      CONTINUE these medications which have NOT CHANGED   Details  amLODipine (NORVASC) 10 MG tablet Take 10 mg by mouth daily.    atenolol (TENORMIN) 50 MG tablet Take 50 mg by mouth daily. Refills: 6    pravastatin (PRAVACHOL) 20 MG tablet Take 20 mg by mouth at bedtime.     timolol (BETIMOL) 0.5 % ophthalmic solution Place 1 drop into both eyes daily.    travoprost, benzalkonium, (TRAVATAN) 0.004 % ophthalmic solution Place 1 drop into both eyes at bedtime.      STOP taking these medications     doxycycline (VIBRA-TABS) 100 MG tablet        No Known Allergies Follow-up Information    Follow up with MOFFETT, JOEL, MD In 2 weeks.   Specialty:  Family Medicine   Contact information:   [place] [address] [phone]       Follow up with Debbora Dus, MD.   Specialty:  Family Medicine   Contact information:   [place] [address] [phone]       Follow up with POOL,HENRY A, MD In 1 week.   Specialty:  Neurosurgery   Contact information:   [address] [place] [address] [phone]        The results of significant diagnostics from this hospitalization (including imaging, microbiology, ancillary and laboratory) are listed  below for reference.    Significant Diagnostic Studies: Ct Head Wo Contrast  10/06/2014   CLINICAL DATA:  80 year old male with traumatic left subdural hematoma, being managed conservatively. Initial encounter.  EXAM: CT HEAD WITHOUT CONTRAST  TECHNIQUE: Contiguous axial images were obtained from the base of the skull through the vertex without intravenous contrast.  COMPARISON:  10/03/2014 and earlier.  FINDINGS: No acute osseous abnormality identified. Visualized paranasal sinuses and mastoids are clear. No acute  orbit or scalp soft tissue findings. Extensive Calcified atherosclerosis at the skull base.  Mixed density left side peripheral and parafalcine hematoma re- identified. Bulky parafalcine component at the level of the centrum semiovale has not significantly changed measuring up to 19-20 mm in maximal thickness. Peripheral component with mass effect most pronounced on the left anterior frontal lobe also is stable measuring 18-19 mm in thickness. Rightward midline shift of 9 mm is stable.  Stable effacement of the left lateral ventricle. Small volume intraventricular hemorrhage re- identified layering in the right occipital horn. Stable mild enlargement of the right lateral ventricle. Stable basilar cisterns. Stable gray-white matter differentiation. No acute cortically based infarct identified. No new areas of intracranial hemorrhage identified.  IMPRESSION: 1. Stable left hemisphere parafalcine and peripheral mixed density subdural hematoma up to 2 cm in thickness. 2. Stable mass effect on the left hemisphere with rightward midline shift of 9 mm, and effaced left lateral ventricle. Stable mildly enlarged right lateral ventricle and trace intraventricular hemorrhage. 3. No new intracranial abnormality identified.   Electronically Signed   By: Lars Pinks M.D.   On: 10/06/2014 10:54   US Soft Tissue Head/neck  10/05/2014   CLINICAL DATA:  Thyroid nodules seen on prior chest CT.  EXAM: THYROID  ULTRASOUND  TECHNIQUE: Ultrasound examination of the thyroid gland and adjacent soft tissues was performed.  COMPARISON:  None.  FINDINGS: Right thyroid lobe  Measurements: 2.0 x 0.4 x 0.7 cm. The thyroid appears very small. There appear to be echogenic shadowing areas within the right thyroid lobe, difficult to measure as discrete nodules.  Left thyroid lobe  Measurements: 1.7 x 0.4 x 0.7 cm. Very small thyroid. Heterogeneous areas within the thyroid with scattered hyperechoic and calcified/shadowing areas. The largest measurable focal hyperechoic area in the midpole measures 7 mm.  Isthmus  Thickness: 1.5 mm.  No nodules visualized.  Lymphadenopathy  None visualized.  IMPRESSION: Very difficult study with small thyroid. Heterogeneous areas within both lobes of the thyroid are difficult to measure. There are areas of calcification with shadowing.   Electronically Signed   By: Rolm Baptise M.D.   On: 10/05/2014 19:54   Dg Chest Port 1 View  10/04/2014   CLINICAL DATA:  Dysphasia.  EXAM: PORTABLE CHEST - 1 VIEW  COMPARISON:  09/17/2014.  FINDINGS: Trachea is midline. Heart size stable. Lungs are low in volume with central pulmonary vascular congestion. A small nodular density projects over the left sixth posterior rib. Additional nodular densities seen on the prior exam are not readily appreciated. No definite pleural fluid.  IMPRESSION: 1. Low lung volumes with central pulmonary vascular congestion. 2. Bilateral pulmonary nodular densities are better seen on the prior exam.   Electronically Signed   By: Lorin Picket M.D.   On: 10/04/2014 07:45    Microbiology: Recent Results (from the past 240 hour(s))  MRSA PCR Screening     Status: Abnormal   Collection Time: 10/03/14  8:57 PM  Result Value Ref Range Status   MRSA by PCR POSITIVE (A) NEGATIVE Final    Comment:        The GeneXpert MRSA Assay (FDA approved for NASAL specimens only), is one component of a comprehensive MRSA  colonization surveillance program. It is not intended to diagnose MRSA infection nor to guide or monitor treatment for MRSA infections. RESULT CALLED TO, READ BACK BY AND VERIFIED WITH: USSERY,K RN [phone] 10/04/14 MITCHELL,L   Clostridium Difficile by PCR     Status: Abnormal  Collection Time: 10/08/14 11:34 AM  Result Value Ref Range Status   C difficile by pcr POSITIVE (A) NEGATIVE Final    Comment: CRITICAL RESULT CALLED TO, READ BACK BY AND VERIFIED WITH: A.TEPE,RN 1326 10/08/14 M.CAMPBELL   Culture, Urine     Status: None   Collection Time: 10/08/14  7:41 PM  Result Value Ref Range Status   Specimen Description URINE, RANDOM  Final   Special Requests NONE  Final   Culture  Setup Time   Final    10/09/2014 11:33 Performed at [facility]   Final    >=100,000 COLONIES/ML Performed at [facility]    Culture   Final    Multiple bacterial morphotypes present, none predominant. Suggest appropriate recollection if clinically indicated. Performed at [facility]    Report Status 10/10/2014 FINAL  Final     Labs: Basic Metabolic Panel:  Recent Labs Lab 10/04/14 0604 10/05/14 0409 10/06/14 0240 10/08/14 0433 10/09/14 0258  NA 135* 133* 134* 139 137  K 4.2 3.9 3.9 4.3 4.1  CL 101 98 99 104 101  CO2 19 27 21 24 22   GLUCOSE 123* 97 231* 131* 128*  BUN 20 11 22  25* 24*  CREATININE 1.03 0.96 1.06 0.99 0.98  CALCIUM 8.7 7.4* 9.0 8.6 8.5  MG  --  1.9 1.9  --   --    Liver Function Tests:  Recent Labs Lab 10/03/14 2250 10/05/14 0409 10/06/14 0240 10/08/14 0433  AST 20 30 15 18   ALT 8 20 8 9   ALKPHOS 54 148* 55 48  BILITOT 1.3* 1.7* 0.3 0.3  PROT 6.4 5.2* 6.6 6.2  ALBUMIN 2.3* 1.7* 2.4* 2.4*   No results for input(s): LIPASE, AMYLASE in the last 168 hours. No results for input(s): AMMONIA in the last 168 hours. CBC:  Recent Labs Lab 10/03/14 2250  10/05/14 0809 10/06/14 0240 10/08/14 0433 10/09/14 0258  10/10/14 0356  WBC 8.0  < > 6.8 6.8 5.7 5.6 7.0  NEUTROABS 6.1  --  4.9 5.3  --   --   --   HGB 8.7*  < > 8.7* 8.4* 7.4* 7.4* 9.6*  HCT 26.9*  < > 27.1* 26.6* 23.4* 23.7* 29.9*  MCV 87.1  < > 84.7 85.3 86.0 85.9 87.4  PLT 386  < > 373 383 323 316 295  < > = values in this interval not displayed. Cardiac Enzymes: No results for input(s): CKTOTAL, CKMB, CKMBINDEX, TROPONINI in the last 168 hours. BNP: BNP (last 3 results) No results for input(s): PROBNP in the last 8760 hours. CBG:  Recent Labs Lab 10/09/14 1137 10/09/14 1616 10/09/14 2247 10/10/14 0626 10/10/14 1116  GLUCAP 251* 127* 133* 143* 164*       Signed:  Danyael Alipio  [facility] 10/10/2014, 12:34 PM

## 2014-10-10 NOTE — Progress Notes (Signed)
CARE MANAGEMENT NOTE 10/10/2014  Patient:  Jacob Hodges, Jacob Hodges   Account Number:  [id number]  Date Initiated:  10/04/2014  Documentation initiated by:  Marvetta Gibbons  Subjective/Objective Assessment:   Pt admitted with SDH     Action/Plan:   PTA pt lived at [facility] following   Anticipated DC Date:  10/07/2014   Anticipated DC Plan:  [place]  In-house referral  Clinical Social Worker      DC Planning Services  CM consult      Choice offered to / List presented to:             Status of service:  Completed, signed off Medicare Important Message given?  YES (If response is "NO", the following Medicare IM given date fields will be blank) Date Medicare IM given:  10/10/2014 Medicare IM given by:  [facility] Date Additional Medicare IM given:   Additional Medicare IM given by:    Discharge Disposition:  [place]  Per UR Regulation:  Reviewed for med. necessity/level of care/duration of stay  If discussed at [place] of Stay Meetings, dates discussed:    Comments:  10/10/2014 1100 Planned dc to SNF. Jonnie Finner RN CCM Case Mgmt phone [phone]

## 2014-11-11 ENCOUNTER — Ambulatory Visit: Payer: Self-pay | Admitting: Internal Medicine

## 2014-12-07 ENCOUNTER — Inpatient Hospital Stay: Payer: Self-pay | Admitting: Internal Medicine

## 2014-12-07 LAB — BASIC METABOLIC PANEL
Anion Gap: 13 (ref 7–16)
BUN: 66 mg/dL — ABNORMAL HIGH (ref 7–18)
Calcium, Total: 9 mg/dL (ref 8.5–10.1)
Chloride: 117 mmol/L — ABNORMAL HIGH (ref 98–107)
Co2: 24 mmol/L (ref 21–32)
Creatinine: 2.56 mg/dL — ABNORMAL HIGH (ref 0.60–1.30)
GFR CALC AF AMER: 31 — AB
GFR CALC NON AF AMER: 25 — AB
GLUCOSE: 561 mg/dL — AB (ref 65–99)
OSMOLALITY: 350 (ref 275–301)
Potassium: 3.7 mmol/L (ref 3.5–5.1)
Sodium: 154 mmol/L — ABNORMAL HIGH (ref 136–145)

## 2014-12-07 LAB — URINALYSIS, COMPLETE
BILIRUBIN, UR: NEGATIVE
Bacteria: NONE SEEN
Glucose,UR: >=500 mg/dL (ref 0–75)
KETONE: NEGATIVE
Leukocyte Esterase: NEGATIVE
Nitrite: NEGATIVE
PH: 5 (ref 4.5–8.0)
Protein: 30
Specific Gravity: 1.018 (ref 1.003–1.030)
WBC UR: 2 /HPF (ref 0–5)

## 2014-12-07 LAB — CBC WITH DIFFERENTIAL/PLATELET
Basophil #: 0.1 10*3/uL (ref 0.0–0.1)
Basophil %: 0.8 %
EOS PCT: 0.2 %
Eosinophil #: 0 10*3/uL (ref 0.0–0.7)
Lymphocyte #: 1.6 10*3/uL (ref 1.0–3.6)
Lymphocyte %: 11.3 %
Monocyte #: 0.9 x10 3/mm (ref 0.2–1.0)
Monocyte %: 6.6 %
NEUTROS PCT: 81.1 %
Neutrophil #: 11.6 10*3/uL — ABNORMAL HIGH (ref 1.4–6.5)

## 2014-12-07 LAB — PROTIME-INR
INR: 1.1
PROTHROMBIN TIME: 14.2 s (ref 11.5–14.7)

## 2014-12-07 LAB — CBC
HCT: 25.3 % — ABNORMAL LOW (ref 40.0–52.0)
HGB: 7.7 g/dL — AB (ref 13.0–18.0)
MCH: 26.5 pg (ref 26.0–34.0)
MCHC: 30.6 g/dL — AB (ref 32.0–36.0)
MCV: 87 fL (ref 80–100)
Platelet: 453 10*3/uL — ABNORMAL HIGH (ref 150–440)
RBC: 2.93 10*6/uL — ABNORMAL LOW (ref 4.40–5.90)
RDW: 18.1 % — AB (ref 11.5–14.5)
WBC: 14.3 10*3/uL — ABNORMAL HIGH (ref 3.8–10.6)

## 2014-12-07 LAB — PHOSPHORUS: PHOSPHORUS: 3.2 mg/dL (ref 2.5–4.9)

## 2014-12-07 LAB — TROPONIN I: TROPONIN-I: 0.04 ng/mL

## 2014-12-07 LAB — MAGNESIUM: Magnesium: 2.3 mg/dL

## 2014-12-08 LAB — URINE CULTURE

## 2014-12-12 ENCOUNTER — Ambulatory Visit: Payer: Self-pay | Admitting: Internal Medicine

## 2014-12-12 LAB — CULTURE, BLOOD (SINGLE)

## 2014-12-12 DEATH — deceased

## 2015-03-03 NOTE — H&P (Signed)
PATIENT NAME:  Jacob Hodges, Jacob Hodges MR#:  [id number] DATE OF BIRTH:  24-Jan-1923  DATE OF ADMISSION:  11/24/2012  PRIMARY CARE PHYSICIAN:  Debbora Dus, MD  ER PHYSICIAN:  Orlie Dakin, MD  CHIEF COMPLAINT:  Weakness and elevated blood sugar.  HISTORY OF PRESENT ILLNESS:  The patient is an 81 year old male with history of hypertension, diabetes, found to have blood sugar up to 11, so he went to his primary doctor's office and was sent here for further evaluation. The patient denies any chest pain. No cough. No trouble breathing, no nausea, no vomiting, but feels like poor appetite and generalized weakness. He went to primary doctor, Dr. Jacqualine Code, on December 31 and he told me that he was started on diabetic medication, metformin 1 gram p.o. b.i.d., because of positive elevated blood glucose. He went to follow up for the blood sugars with his primary doctor.   The patient is found to have a UTI, along with acute renal failure, so  I was asked to admit him. The patient denies any trouble urinating. No dysuria. No hematuria. No incontinence. No prostate problems. No fever and also denies any history of kidney problems.   PAST MEDICAL HISTORY: Hospitalization in 2012 for DVT of the leg. Diabetes, hypertension, hyperlipidemia and glaucoma. He was diagnosed with diabetes at that time in 2012.   ALLERGIES: No known allergies.   SOCIAL HISTORY: No smoking. No drinking. No drugs. Married, lives with wife.   PAST SURGICAL HISTORY: Hernia repair.   FAMILY HISTORY: No hypertension or diabetes.   REVIEW OF SYSTEMS: Denies any fever or of headache. Complaints of weakness.  EYES: No blurred vision.   ENT: No tinnitus. No ear pain. No hearing loss. No epistaxis. No difficulty swallowing.  RESPIRATORY: Has no cough. No wheezing.  CARDIOVASCULAR: No chest pain. No orthopnea.  GASTROINTESTINAL: No nausea. No vomiting, but complains of poor appetite.  GENITOURINARY: No dysuria.  ENDOCRINE: Denies any polyuria  or polydipsia.  MUSCULOSKELETAL: Denies any joint pains.  NEUROLOGIC: No numbness. No weakness.  PSYCHIATRIC: No anxiety or insomnia.   MEDICATIONS:  Amlodipine 10 mg daily, brimonidine 5% eye drops in each eye once a day. Cipro 500 mg p.o. b.i.d. started on December 19, actually this is over. HCTZ/lisinopril 12.5/10 mg p.o. daily, metformin 1 gram p.o. b.i.d., pravastatin 20 mg daily. He is on prednisone from  December 30 to January 6. He is unable to tell me why he took the prednisone. Travatan 0.004% one drop in each eye once a day at bedtime. The patient said that he is still taking Coumadin 5 mg daily. We will get the PT/INR checked and also check the lower extremity ultrasound to see if really needs Coumadin or not.   PHYSICAL EXAMINATION:  VITALS:  Temperature 97.9, heart rate 100, blood pressure 113/68, respirations 20, sats 98% on room air. Alert, awake, oriented.  HEAD:  Atraumatic, normocephalic. Pupils equally reacting to light. Extraocular movements are intact.  ENT: No tympanic membrane congestion. No turbinate hypertrophy. No oropharyngeal erythema.  NECK: Normal range of motion. Thyroid not enlarged. Supple. No lymphadenopathy.  RESPIRATORY: Clear to auscultation. No wheeze, no rales.  CARDIOVASCULAR: S1 and S2 regular. No murmurs. PMI not displaced.  ABDOMEN: Soft, nontender, nondistended. Bowel sounds present.  MUSCULOSKELETAL: Strength 5/5 in upper and lower extremities.  SKIN: No skin rashes, no erythema, no nodules. Warm and dry.   NEUROLOGIC: Cranial nerves II through XII intact. DTRs 2+ bilaterally. No dysarthria or aphasia.  PSYCHIACTRIC: Oriented to time, place, person.  LABORATORY, DIAGNOSTIC AND RADIOLOGICAL DATA: WBC 11.3, hemoglobin 10.3, hematocrit 31.1, platelets 303.   ELECTROLYTES: Sodium 139, potassium 3.7, chloride 106, bicarbonate 23, BUN 61, creatinine 2.1, serum glucose 180. Urine is turbid, 2+ blood and also 3+ leukocyte esterase, 750 WBC, bacteria 1+.    ASSESSMENT AND PLAN:  This is an 81 year old male patient with acute renal failure with urinary tract infection. The patient is going to be admitted to the hospitalist service for acute renal failure and urinary tract infection, started on Rocephin and follow the urine cultures.  1.  Acute renal failure secondary to poor p.o. intake and dehydration. Hold HCTZ/lisinopril at this time. Monitor BMP tomorrow. Avoid nephrotoxic agents, and also hold the metformin at this time, and if the renal function does not improve with IV hydration, consider renal ultrasound tomorrow.  2.  Diabetes mellitus type 2 with elevated blood sugar.  Hold the metformin. Just continue fingersticks with coverage. Nausea and also poor p.o. intake could be probably some acute renal failure and also with urinary tract infection.  3.  Lower extremity deep vein thrombosis. The patient says he still taking Coumadin, but he had deep vein thrombosis 2 years ago. The patient then have a repeat ultrasound of the left leg to see if he daily deep vein thrombosis are not and until then, continue the Coumadin. 4.  History of glaucoma. Continue eye drops, timolol and Travatan.   Condition at this time is stable.    TIME SPENT ON HISTORY AND PHYSICAL: About 55 minutes    ____________________________ Epifanio Lesches, MD sk:cc D: 11/24/2012 17:22:00 ET T: 11/24/2012 18:05:21 ET JOB#: [id number]  cc: Epifanio Lesches, MD, <Dictator> Epifanio Lesches MD ELECTRONICALLY SIGNED 01/01/2013 9:11

## 2015-03-03 NOTE — Discharge Summary (Signed)
DATE OF BIRTH:  14-Jun-1923  PRIMARY CARE PHYSICIAN: Dr. Debbora Dus.  DISCHARGE DIAGNOSES:  Urinary tract infection, acute renal failure, right lower extremity deep venous thrombosis, hypertension, diabetes.   CONDITION:  Stable.   CODE STATUS:  FULL CODE.   MEDICATIONS:  Metformin 1000 mg p.o. b.i.d., pravastatin 20 mg p.o. at bedtime, Norvasc 10 mg p.o. daily, Travatan Z 0.004% ophthalmic solution 1 drop to each eye at bedtime, brimonidine 0.15% ophthalmic solution 1 drop to each eye in the morning, timolol 0.5% ophthalmic solution 1 drop to each eye once a day, atenolol 25 mg p.o. daily, Coumadin 6 mg p.o. daily.   DIET:  Low sodium, low fat, low cholesterol, ADA diet.   ACTIVITY:  As tolerated.   FOLLOWUP CARE:  Follow up with PCP within 1 week. The patient also needs a followup INR in PCP's office on January 21st, and follow up with PCP within 1 week to follow up INR and adjust the Coumadin dose.   REASONS FOR ADMISSION:  Weakness and elevated blood sugar.   HOSPITAL COURSE: 1.  The patient is an 81 year old African American male with a history of hypertension, diabetes, who was found to have an elevated blood sugar. So, he went to PCP's office and was sent to ED for further evaluation. The patient was found to have a UTI, along with acute renal failure. So, the patient was admitted to the medical floor. For a detailed history and physical examination, please refer to the admission note dictated by Dr. Vianne Bulls. Laboratory data on admission date showed WBC 11.3, hemoglobin 10.3, platelets 303. Sodium 139, potassium 3.7, chloride 106, BUN 61, creatinine 2.1, glucose 180. Urinalysis showed WBC 750. After admission, the patient has been treated with Rocephin IVPB for urine cultures showing E. coli, which is sensitive to Rocephin.  2.  For acute renal failure, the patient's hydrochlorothiazide and lisinopril were on hold. The patient was treated with IV fluid support. BUN decreased to 22, and  creatinine decreased to 1.05.  3.  For diabetes, the patient has been treated with a sliding scale. Metformin was on hold due to renal failure.  4.  Deep vein thrombosis. The patient has a history of the left lower extremity deep venous thrombosis. However, repeated venous duplex at this time showed no evidence of deep vein thrombosis in the left lower extremity, but has a near occlusive thrombosis in the right popliteal vein. So, the patient has been treated with Lovenox 1 mg/kg q. 12 hours. With Coumadin, INR increased to 1.5 today. The patient is clinically stable. He will be discharged to home today with Coumadin 6 mg p.o. daily. The patient needs a follow up INR with the primary care physician. I discussed the patient's discharge plan with the patient.   TIME SPENT:  About 32 minutes.    ____________________________ Demetrios Loll, MD qc:ms D: 11/29/2012 13:12:47 ET T: 11/29/2012 19:15:02 ET JOB#: [id number]  cc: Demetrios Loll, MD, <Dictator> Demetrios Loll MD ELECTRONICALLY SIGNED 12/01/2012 16:26

## 2015-03-03 NOTE — Consult Note (Signed)
Reason for Visit: This 81 year old Male patient presents to the clinic for initial evaluation of  prostate cancer .   Referred by Dr. Jacqualine Code.  Diagnosis:  Chief Complaint/Diagnosis   81 year old male with adenocarcinoma of the prostate at least stage IIa presenting with Gleason 8 (4+4) as well as a PSAgreater than 150  Pathology Report pathology report reviewed   Imaging Report bone scan ordered   Referral Report clinical notes reviewed   Planned Treatment Regimen possible hormone suppression therapy versus radiation therapy this   HPI   Jacob Hodges is an 81 year old male who presented with anemia with a hemoglobin in the 8 range. This he also had been recently diagnosed by Dr. Ernst Spell when he presented with an elevated PSA and was found to have by lobar adenocarcinoma highest score Gleason 8 (4+4) with mostly Gleason 7 (4+3). PSA was recently performed and was greater than 150. He is having no urinary symptoms or bone pain. He does have a history of occlusive thrombus in his right popliteal vein and is been on chronic Coumadin. He also recently was admitted to the hospital in January with hyperglycemia UTI along with acute renal failure.his prostate biopsy was performed back in December of 2013.  Past Hx:    onychomycosis:    Glaucoma:    Anemia:    Prostate cancer:    Hypercholesterolemia:    Hypotension:    Diabetes:    Hernia Repair:   Past, Family and Social History:  Past Medical History positive   Cardiovascular hyperlipidemia; hypertension   Endocrine diabetes mellitus   Past Surgical History herniorrhaphy repair   Past Medical History Comments glaucoma, onychomycoses, anemia   Family History noncontributory   Social History noncontributory   Additional Past Medical and Surgical History accompanied by wife of 36 years today   Allergies:   No Known Allergies:   Home Meds:  Home Medications: Medication Instructions Status  Coumadin 6 mg oral tablet 1  tab(s) orally once a day Active  Travatan Z 0.004% ophthalmic solution 1 drop(s) to each eye once a day (at bedtime) Active  brimonidine 0.15% ophthalmic solution 1 drop(s) to each eye once a day (in the morning) Active  timolol 0.5% ophthalmic solution 1 drop(s) to each eye once a day Active  atenolol 25 mg oral tablet 1 tab(s) orally once a day Active  metformin 1000 mg oral tablet 1 tab(s) orally 2 times a day Active  pravastatin 20 mg oral tablet 1 tab(s) orally once a day (at bedtime) Active  amlodipine 10 mg oral tablet 1 tab(s) orally once a day Active  hydrochlorothiazide 25 mg oral tablet 1 tab(s) orally once a day Active   Review of Systems:  General negative   Performance Status (ECOG) 0   Skin negative   Breast negative   Ophthalmologic negative   ENMT negative   Respiratory and Thorax negative   Cardiovascular negative   Gastrointestinal negative   Genitourinary see HPI   Musculoskeletal negative   Neurological negative   Psychiatric negative   Hematology/Lymphatics negative   Endocrine see HPI   Allergic/Immunologic negative   Review of Systems   according to nurse's notesPatient denies any weight loss, fatigue, weakness, fever, chills or night sweats. Patient denies any loss of vision, blurred vision. Patient denies any ringing  of the ears or hearing loss. No irregular heartbeat. Patient denies heart murmur or history of fainting. Patient denies any chest pain or pain radiating to her upper extremities. Patient denies any shortness  of breath, difficulty breathing at night, cough or hemoptysis. Patient denies any swelling in the lower legs. Patient denies any nausea vomiting, vomiting of blood, or coffee ground material in the vomitus. Patient denies any stomach pain. Patient states has had normal bowel movements no significant constipation or diarrhea. Patient denies any dysuria, hematuria or significant nocturia. Patient denies any problems walking,  swelling in the joints or loss of balance. Patient denies any skin changes, loss of hair or loss of weight. Patient denies any excessive worrying or anxiety or significant depression. Patient denies any problems with insomnia. Patient denies excessive thirst, polyuria, polydipsia. Patient denies any swollen glands, patient denies easy bruising or easy bleeding. Patient denies any recent infections, allergies or URI. Patient "s visual fields have not changed significantly in recent time.  Nursing Notes:  Nursing Vital Signs and Chemo Nursing Nursing Notes: *CC Vital Signs Flowsheet:   12-Feb-14 09:23  Temp Temperature 95.5  Pulse Pulse 66  Respirations Respirations 22  SBP SBP 189  DBP DBP 86  Current Weight (kg) (kg) 72.6   Physical Exam:  General/Skin/HEENT:  General normal   Skin normal   Eyes normal   ENMT normal   Head and Neck normal   Additional PE ell-developed well-nourished male in NAD. He appears younger than stated age. Lungs are clear to A&P. Cardiac examination shows a 3/6 systolic ejection murmur. Abdomen is benign with no organomegaly or masses noted. On rectal exam rectal sphincter tone is good. Prostate is firm throughout no discreet nodularity is identified. Sulcus is preserved bilaterally.   Breasts/Resp/CV/GI/GU:  Respiratory and Thorax normal   Cardiovascular normal   Gastrointestinal normal   Genitourinary normal   MS/Neuro/Psych/Lymph:  Musculoskeletal normal   Neurological normal   Lymphatics normal   Relevent Results:   Relevant Scans and Labs bone scan has been ordered   Assessment and Plan: Impression:   at least stage II adenocarcinoma of the prostate in 81 year old male with extremely high PSA of greater than 150 and Gleason 8 adenocarcinoma. Plan:   at this time I've ordered a bone scan to rule out possible metastatic disease to his bone. Should his bone scan be normal may offer radiation therapy to his prostate and pelvic nodes  along with Lupron suppression therapy. If he does have bone metastasis at this time will start him on Lupron therapy and total androgen blockade. I have discussed the case personally with medical oncology. Should he have stage IV disease we'll refer him back to medical oncology for management. All this was explained in detail to the patient and his wife. I have set him up for followup today's after his bone scan results are available.  I would like to take this opportunity to thank you for allowing me to continue to participate in this patient's care.  CC Referral:  cc: Dr. Debbora Dus   Electronic Signatures: Baruch Gouty, Roda Shutters (MD)  (Signed 12-Feb-14 12:58)  Authored: HPI, Diagnosis, Past Hx, PFSH, Allergies, Home Meds, ROS, Nursing Notes, Physical Exam, Relevent Results, Encounter Assessment and Plan, CC Referring Physician   Last Updated: 12-Feb-14 12:58 by Armstead Peaks (MD)

## 2015-03-04 NOTE — H&P (Signed)
PATIENT NAME:  Jacob Hodges, Jacob Hodges MR#:  [id number] DATE OF BIRTH:  July 20, 1923  DATE OF ADMISSION:  07/06/2014  PRIMARY CARE PHYSICIAN:  Debroah Baller, MD   CHIEF COMPLAINT: Toe infection.   HISTORY OF PRESENT ILLNESS: This is a very pleasant 81 year old male who is status post a right little toe amputation approximately 3 weeks ago with Dr. Lucky Cowboy, who presents with above complaint. Patient has been followed by a home health care nurse, and she noticed that this looked infected, so he came to the ER for further evaluation. In the ER, he had an x-ray which was positive for osteomyelitis, so ER physician asked [place] to evaluate for admission.   REVIEW OF SYSTEMS:   CONSTITUTIONAL: No fever, fatigue, weakness.  EYES:  No blurred or double vision, positive glaucoma.  EARS AND NOSE AND THROAT: No tinnitus, positive hearing loss.  RESPIRATORY: No cough, wheezing, hemoptysis, or COPD.  CARDIOVASCULAR:  No chest pain, orthopnea, edema, arrhythmia, dyspnea on exertion.  GASTROINTESTINAL: No nausea, vomiting, diarrhea, abdominal pain, melena, or ulcerative symptoms.  GENITOURINARY:  No dysuria, hematuria.  ENDOCRINE:  No polyuria, polydipsia,  HEM, no bleeding, swollen glands.  SKIN:  Positive infection of the toe of the right foot, little toe.   NEUROLOGIC: No history of CVA, TIA, or seizures.  PSYCHIATRIC: No history of anxiety, or depression.    PAST MEDICAL HISTORY: 1.  Hypertension.  2.  Diabetes.  3.  History of prostate cancer.  4.  History of DVT on Coumadin.  5.  History of glaucoma.  6.  Chronic renal failure stage 4.     MEDICATIONS:  1.  Norvasc 10 mg daily.  2.  Atenolol 25 mg daily.  3.  Coumadin 6 mg daily.  4.  Darvocet 20 mg  at bedtime.  5.  Timolol eyedrops.  6.  Travoprost eyedrops.  7.  Brimonidine eyedrops.   FAMILY HISTORY: Positive for hypertension.   PAST SURGICAL HISTORY:av graft  SOCIAL HISTORY:  No tobacco, alcohol or drug use.  He lives with his wife of 36 years.    ALLERGIES: No known drug allergies.   PHYSICAL EXAMINATION:  VITAL SIGNS: The patient is afebrile, temperature 98.7, pulse 77, respirations 18, blood pressure 173/109, sat 100% on room air; it should be noted that noted that patient did have a fever of 100.3, on admission to the ER.  GENERAL: Patient is alert and oriented, no in acute distress.  HEENT: Head is atraumatic. Pupils are round and reactive. Sclerae are anicteric. Mucous membranes are moist.  Oropharynx is clear.  NECK: Supple without JVD, carotid bruit, or enlarged thyroid.  CARDIOVASCULAR: Regular rate and rhythm. No murmurs, gallops, or rubs, PMI is not displaced.  LUNGS: Clear to auscultation without crackles, rales, rhonchi, or wheezing, normal to percussion.  ABDOMEN: Bowel sounds are positive, nontender, nondistended, no hepatosplenomegaly.  SKIN: His right little toe has a large area of purulent discharge with some mild granulation tissue around it.   LABORATORY:   1.  Pertinent, white blood cells 9.6, hemoglobin 8.7, hematocrit 28, platelets are 333,000, sodium 138, potassium 5.2, chloride 106, bicarbonate 23, BUN 22, creatinine 1.42, glucose is 126, INR is 1.1.  2.  Right foot x-ray shows osteomyelitis at the amputation site.   ASSESSMENT AND PLAN: An 81 year old male with a history of deep vein thrombosis on Coumadin, chronic kidney disease, stage IV, status post recent amputation of the little toe of the right foot who comes in with purulent drainage from that area,  found to have osteomyelitis on x-ray.  1.  Osteomyelitis of the right foot, little toe.  Patient will be admitted with intravenous antibiotics including Zosyn. I have ordered some cultures including wound culture, and blood cultures. We will consult infectious disease and Dr. Lucky Cowboy, who did the amputation; further recommendations after consultation.  2.  History of deep vein thrombosis on Coumadin. INR is 1.1. The patient will resume his Coumadin.  3.   Accelerated hypertension. The patient did not take his medications this morning.  We will resume these, monitor his blood pressure, and I have written for p.r.n. hydralazine order.  4.  Stage IV chronic kidney disease which seems to be stable, at this time.  5.  The patient is a full CODE STATUS.   TIME SPENT: Approximately 50 minutes.   Plan of care was discussed with the patient's wife.    ____________________________ Donell Beers. Benjie Karvonen, MD spm:nt D: 07/06/2014 19:09:15 ET T: 07/06/2014 20:23:17 ET JOB#: [id number]  cc: Jmari Pelc P. Benjie Karvonen, MD, <Dictator> Milinda Pointer. Jacqualine Code, MD Ulice Bold P Willean Schurman MD ELECTRONICALLY SIGNED 07/07/2014 11:19

## 2015-03-04 NOTE — Consult Note (Signed)
PATIENT NAME:  Jacob Hodges, Jacob Hodges MR#:  [id number] DATE OF BIRTH:  June 13, 1923  DATE OF CONSULTATION:  07/08/2014  REFERRING PHYSICIAN:   CONSULTING PHYSICIAN:  Cheral Marker. Ola Spurr, MD  REASON FOR CONSULTATION: Foot osteomyelitis.   HISTORY OF PRESENT ILLNESS: This is a very pleasant 81 year old gentleman with a history of recent right 5th toe amputation by Dr. Lucky Cowboy approximately 3 weeks ago who was admitted August 26th with increasing drainage and infection at the foot site. X-rays done in the ER looked like he had underlying osteomyelitis so he was admitted for IV antibiotics. Since then he has been continued on vancomycin and Zosyn and the wound has been clinically stable. He has been afebrile.   PAST MEDICAL HISTORY: 1.  Hypertension.  2.  Diabetes.  3.  Prostate cancer.  4.  Prior DVT.  5.  Glaucoma.  6.  Renal failure, stage IV.   PAST SURGICAL HISTORY: Partial amputation of his left 5th toe.   SOCIAL HISTORY: Lives with his wife. He does not smoke, drink or use drugs.   FAMILY HISTORY: Positive for hypertension.   ALLERGIES: No known drug allergies.   ANTIBIOTICS SINCE ADMISSION: Include Zosyn.  REVIEW OF SYSTEMS: Eleven systems reviewed and negative, except as per HPI.  PHYSICAL EXAMINATION: VITAL SIGNS: Temperature 98.5, pulse 70, blood pressure 167/84, respirations 18, sat 95% on room air.  GENERAL: He is pleasant, interactive, in no acute distress, appears younger than his stated age.  HEENT: Pupils equal, round, and reactive to light and accommodation. Extraocular movements are intact. Sclerae are anicteric. Oropharynx is clear.  NECK: Supple.  HEART: Regular.  LUNGS: Clear to auscultation bilaterally.  ABDOMEN: Soft, nontender, nondistended. No hepatosplenomegaly.  EXTREMITIES: On his right foot, the 5th toe, he has an area of opening with some exudate and exposed tendon. There is no exposed bone that I can palpate. It is somewhat tender to palpation.  NEUROLOGIC: He is  alert and oriented x3. Grossly nonfocal neuro exam.   DIAGNOSTIC DATA: Labs are reviewed. Renal function shows a creatinine of 1.42 with an estimated GFR of 50. White count was 9.6, hemoglobin 8.7 and platelets 333,000. Sed rate was 120. Blood cultures x2 are no growth to date. Wound culture done yesterday shows heavy growth of pseudomonas and multiple other organisms being isolated.   Imaging: X-ray of the right foot shows status post amputation at the level of the distal 5th metatarsal with bony irregularity at the amputation site worrisome for osteomyelitis.   IMPRESSION: An 81 year old gentleman with history of diabetes and peripheral vascular disease admitted with presumed osteomyelitis of his right 5th metatarsal following amputation several weeks ago. Culture is now growing pseudomonas, that just came back. Other organisms are being isolated. His ESR is greater than 120. He is due to have repeat debridement September 2nd with Dr. Lucky Cowboy.  RECOMMENDATIONS: 1.  Continue vancomycin and we will use oral Cipro and Flagyl at this point. Will have to follow up on the resistance of the pseudomonas and potentially may need to start him on meropenem or ertapenem based on those results.  2.  I will follow the patient in 1 week's time. He will follow up with Dr. Lucky Cowboy for surgery next week.  3.  He will need weekly CBC, C-met and vancomycin trough with a goal of 15 to 20.   Thank you for the consult. I will be glad to follow with you.   ____________________________ Cheral Marker. Ola Spurr, MD dpf:sb D: 07/08/2014 14:00:06 ET T: 07/08/2014 14:42:03  ET JOB#: [id number]  cc: Cheral Marker. Ola Spurr, MD, <Dictator> DAVID Ola Spurr MD ELECTRONICALLY SIGNED 07/10/2014 22:24

## 2015-03-04 NOTE — Discharge Summary (Signed)
PATIENT NAME:  Jacob Hodges, Jacob Hodges MR#:  [id number] DATE OF BIRTH:  [birth year]  DATE OF ADMISSION:  07/06/2014 DATE OF DISCHARGE:    DISCHARGE DIAGNOSES: 1.  Osteomyelitis of the right fifth metatarsal requiring 6 weeks of IV antibiotics through PICC line, and likely debridement.  2.  Accelerated hypertension, improving.   SECONDARY DIAGNOSES: 1.  Hypertension.  2.  Diabetes.  3.  History of prostate cancer with a history of deep vein thrombosis, and Coumadin.  4.  Glaucoma.  5.  Chronic kidney disease stage IV.   CONSULTATIONS:  1.  Vascular surgery, Algernon Huxley, MD  2.  Infectious disease, Leonel Ramsay, MD   PROCEDURES AND RADIOLOGY:   1.  Chest x-ray on the 27th of August, showed PICC line  deep at the junction of the SVC, and right atrium.  2.  Right foot x-ray on the 26th of August, showed bony irregularity at amputation site of distal fifth metatarsal, worrisome for osteomyelitis, skin wound at amputation site containing debris.  3.  Major laboratory panel, blood cultures x 2, were negative on the 26th of August.  4.  Wound culture growing heavy growth of Pseudomonas aeruginosa.   HISTORY AND SHORT HOSPITAL COURSE: The patient is an 81 year old male with above-mentioned medical problem who was admitted for osteomyelitis of the right fifth metatarsal. Please see Dr. Ulice Bold Mody's dictated history and physical for further details. The patient was evaluated by vascular surgery, Dr. Leotis Pain, who recommended outpatient debridement, which is scheduled for 2nd of September, on Wednesday. Infectious disease consultation was obtained with Dr. Adrian Prows, who recommended prolonged course of IV antibiotics via PICC line. Patient had a PICC line placed. Patient has been doing okay and is being discharged to rehab for short-term time, and then eventually will go home.   On the date of discharge, his vital signs were as follows: Temperature 98.5, heart rate 70 per minute; respirations 18  per minute; blood pressure 158/68 mmHg, he is saturating 99% on room air.   PHYSICAL EXAMINATION on the date of discharge: CARDIOVASCULAR: On the date of discharge, S1, S2 normal. No murmurs, rubs, or gallop.  LUNGS: Clear to auscultation bilaterally. No wheezing, rales, rhonchi, or crepitation.  ABDOMEN: Soft, benign.  NEUROLOGIC: Nonfocal examination is right foot, fifth toe, shows area opening with some exudate, and exposed tendon, somewhat tender to palpation.  All other physical examination remained at baseline.   DISCHARGE MEDICATIONS: Showed:  1.  Atorvastatin 20 mg p.o. at bedtime.  2.  Travatan 0.004% ophthalmic solution 1 drop to each eye at bedtime.  3.  Brimonidine 0.15% ophthalmic solution to each eye in the morning.  4.  Timolol 0.5% ophthalmic drop to each eye daily.  5.  Amlodipine 10 mg p.o. daily.  6.  Tylenol 650 mg p.o. every 4 hours as needed.  7.  Warfarin 6 mg p.o. daily.  8.  Latanoprost ophthalmic 0.005% one drop to each eye at bedtime.  9.  Vitamin C 500 mg p.o. b.i.d. 10.  Atenolol 50 mg p.o. daily.  11.  Collagen S topical application to affected area once daily.  12.  Vancomycin 1000 mg IV daily for 6 weeks.  13.  Ciprofloxacin 500 mg p.o. b.i.d. until seen by Dr. Ola Spurr in follow-up.  14.  Flagyl 500 mg p.o. every 8 hours until seen by Dr. Ola Spurr at follow-up.   DISCHARGE DIET: Low sodium.    DISCHARGE ACTIVITY: As tolerated.   DISCHARGE INSTRUCTIONS AND FOLLOW-UP: Patient was  instructed to follow up with Dr. Leotis Pain, on coming Wednesday on 2nd September as scheduled for outpatient debridement. He will need follow-up with Dr. Adrian Prows in 1 week for decision for changing antibiotic depending on the culture results.  He will also need follow-up with Dr. Debbora Dus in 2 to 4 weeks.    We will continue Santyl dressing changes at the facility.   TOTAL TIME DISCHARGING THIS PATIENT:  Was 45 minutes.     ____________________________ Lucina Mellow. Manuella Ghazi, MD vss:nt D: 07/08/2014 15:36:34 ET T: 07/08/2014 16:36:39 ET JOB#: [id number]  cc: Shea Kapur S. Manuella Ghazi, MD, <Dictator> Milinda Pointer. Jacqualine Code, MD Cheral Marker. Ola Spurr, MD Algernon Huxley, MD   Lucina Mellow [facility] MD ELECTRONICALLY SIGNED 07/14/2014 10:02

## 2015-03-04 NOTE — Op Note (Signed)
PATIENT NAME:  Jacob Hodges, Jacob Hodges MR#:  [id number] DATE OF BIRTH:  Feb 04, 1923  DATE OF PROCEDURE:  07/04/2014  PREOPERATIVE DIAGNOSES:  1. Peripheral arterial disease with ulceration, right lower extremity.  2. Status post right fifth ray amputation for gangrene.  3. Hypertension.   POSTOPERATIVE DIAGNOSES:  1. Peripheral arterial disease with ulceration, right lower extremity.  2. Status post right fifth ray amputation for gangrene.  3. Hypertension.   PROCEDURES:  1. Ultrasound guidance for vascular access, left femoral artery.  2. Catheter placement to distal right peroneal artery from left femoral approach.  3. Aortogram and selective right lower extremity angiogram.  4. Percutaneous transluminal angioplasty of right peroneal artery with 2-mm and 3-mm diameter angioplasty balloon.  5. Percutaneous transluminal angioplasty of tibioperoneal trunk with 3-mm diameter angioplasty balloon.  6. Percutaneous transluminal angioplasty of right popliteal artery with 5-mm diameter Lutonix drug-coated angioplasty balloon.  7. Percutaneous transluminal angioplasty of proximal superficial femoral artery with a 6-mm diameter Lutonix drug-coated angioplasty balloon.  8. StarClose closure device, left femoral artery.   SURGEON: Dr. Lucky Cowboy.   ANESTHESIA: Local with moderate conscious sedation.   ESTIMATED BLOOD LOSS: 25 mL.  FLUOROSCOPY TIME: About 22 minutes.  CONTRAST: 90 mL of contrast were used.   INDICATION FOR PROCEDURE: This is an 81 year old gentleman who I have previously performed revascularization on the right lower extremity many months ago. He initially had some improvement, but now his right fifth toe amputation site is not healing well and he has a chronic right heel ulcer, which is not improving. The patient is brought in for angiography to evaluate his perfusion to see if this can be improved. Risks and benefits are discussed and informed consent was obtained.   DESCRIPTION OF THE  PROCEDURE: The patient was brought to the vascular suite. Groins were shaved and prepped and a sterile surgical field was created. The ultrasound machine was used to visualize a patent left femoral artery. It was then accessed under direct ultrasound guidance without difficulty with a Seldinger needle and a permanent image was recorded. A J-wire and 5 French sheath were then placed. A pigtail catheter was placed in the aorta at the L1-L2 level and AP aortogram was performed. This demonstrated the aorta and iliac segments to be patent, the renal arteries to be patent. No new stenosis in the iliacs would explain his reduced perfusion. I then crossed the aortic bifurcation and advanced to the right femoral head and selective right lower extremity angiogram was then performed. This demonstrated about a 56% stenosis in the proximal SFA just before the previously placed stent. The stent was actually widely patent. The popliteal artery then occluded just above the knee. There is very poor distal runoff through all vessels and diffusely diseased tibials below the popliteal occlusion. The patient was heparinized. A 6 French Ansell sheath was placed over a Truman advantage wire. The proximal SFA lesion was crossed without difficulty. I was able to gain access into the popliteal occlusion and get a catheter down to the peroneal artery. I tried under direct into the posterior tibial artery, which both were densely calcified but appeared to be the larger vessel but could not track into the posterior tibial artery. Intraluminal flow within the peroneal artery was confirmed but the vessel was diffusely diseased and I was able to continue to track down until just above the level of the ankle. At this point a 0.018 balloon would not cross the mid peroneal artery occlusion and I had to exchange for  a 0.014 wire. I used a 0.014 two-millimeter balloon and then a 3-mm balloon in the peroneal artery throughout its entire course up to the  tibioperoneal trunk and up to the popliteal artery. The popliteal occlusion was then treated with a 5-mm diameter drug-coated angioplasty balloon. Despite all this there was still very poor runoff distally. The patient also had a markedly reduced ejection fraction, which was obvious on his angiograms. We ballooned this multiple times. There were large collaterals that still filled faster than the native vessels, and at this point, I was not sure what further work we could do having treated the entire peroneal artery, the tibioperoneal trunk, and the popliteal artery. With his SFA stents being patent, I ballooned the lesion in the proximal superficial femoral artery just above the stents with a 6-mm diameter x 10 cm in length Lutonix drug-coated angioplasty balloon and there was improvement with only about a 30% residual stenosis after angioplasty. His flow remained slow distally, but I do not know what more I can do to improve this. At this point, I elected to terminate the procedure. The sheath was pulled back to the ipsilateral external iliac artery and oblique arteriogram was performed. A StarClose closure device was deployed in the usual fashion with excellent hemostatic result. The patient tolerated the procedure well and was taken to the recovery room in stable condition.    ____________________________ Algernon Huxley, MD jsd:lt D: 07/04/2014 15:27:06 ET T: 07/05/2014 02:57:40 ET JOB#: [id number]  cc: Algernon Huxley, MD, <Dictator> Algernon Huxley MD ELECTRONICALLY SIGNED 07/06/2014 10:34

## 2015-03-04 NOTE — Discharge Summary (Signed)
PATIENT NAME:  Jacob Hodges, Jacob Hodges MR#:  [id number] DATE OF BIRTH:  1923-03-17  DATE OF ADMISSION:  05/03/2014 DATE OF DISCHARGE:  05/10/2014  ADMITTING DIAGNOSIS: Coagulopathy.   DISCHARGE DIAGNOSES: 1. Coagulopathy acquired due to Coumadin, resolved.  2. Acute on chronic renal failure due to ATN resolved.  3. History of chronic kidney disease stage 3.  4. History of peripheral vascular disease with right fifth toe gangrene,  status post PTA to right lower extremity superficial femoral artery, popliteal artery, TP trunk, peroneal artery on the 05/06/2014 by Dr. Lucky Cowboy.  5. Right knee acute CPPD arthritis status post arthrocentesis and steroid injection by Dr. Rudene Christians on the 05/07/2014 6. Diabetes mellitus.  7. Anemia of chronic disease, status post 2 units of packed red blood cell transfusion on the 05/08/2014 and well as 05/09/2014 8. Secondary hyperparathyroidism, metabolic acidosis now on bicarbonate orally.  9. Hypertension, prostate cancer, deep vein thrombosis off Coumadin, glaucoma.   DISCHARGE CONDITION: Stable.   DISCHARGE MEDICATIONS: The patient is to continue:  1. Pravachol 20 mg p.o. daily.  2. Travatan 0.004% ophthalmic solution one drop to each eye at bedtime.  3. Brimonidine 0.15% one drop to each eye in the morning.  4. Atenolol 0.5% one drop each eye daily.  5. Amlodipine 10 mg p.o. daily.  6. Acetaminophen/oxycodone 325/10 mg 1 tablet every eight hours as needed.  7. Atenolol 50 mg p.o. daily.  8. Tylenol 325 mg 2 tablets every four hours as needed.  9. Sodium bicarbonate 650 mg p.o. twice daily.  10. Ergocalciferol 50,000 units 1 capsule once weekly.  11. Amoxicillin clavulanate 500/125 mg 1 tablet twice daily to be given for 10 days or longer depending on Dr. Bunnie Domino recommendations and please follow up with Dr. Lucky Cowboy in regards to antibiotic therapy.   The patient is not to take Coumadin, Bactrim or ciprofloxacin unless recommended by primary care physician, Coumadin to be  restarted after right fifth toe amputation.  HOME OXYGEN: None.   DIET: 2 grams salt, low-fat, low-cholesterol, carbohydrate-controlled renal diet, regular consistency.   ACTIVITY LIMITATIONS: As tolerated.   REFERRALS: To physical therapy.    FOLLOWUP APPOINTMENT: Dr. Lucky Cowboy in two days after discharge, Dr. Jacqualine Code, primary physician in two days after discharge.    CONSULTANTS: Care management, social work, physical therapy Dr. Jeannie Fend, Dr. Hessie Knows, Dr. Leotis Pain.   RADIOLOGIC STUDIES: Ultrasound of kidneys 05/05/2014, showed bilateral echogenic kidneys suspicious for medical renal disease. A cyst is noted in upper pole of right kidney measuring 1.7 over 1.5 cm. No hydronephrosis or diagnostic renal calculus. Unremarkable urinary bladder. Right knee complete x-ray, the 05/06/2014, revealing advanced osteoarthritic changes of right knee.   The patient is 81 year old African American male with past medical history significant for history of renal failure CKD, also right fifth toe gangrene, presents to the hospital with coagulopathy. Please refer to Dr. Gardiner Coins admission on  05/03/2014. Apparently, the patient was admitted to special recovery for planned angiogram; however, his labs were taken and he was noted to have abnormal INR level of more than 15 and he was admitted to the hospital for further evaluation. On arrival to the hospital, the patient's temperature was 98, pulse was 55, respiration rate was 16, blood pressure 137/65, saturation was 95% on room air. Physical exam was unremarkable. The patient did have gangrene of right foot, pinky toe.  It was black and very foul smelling with eschar. Otherwise, no significant abnormalities were found.   The patient's lab data done on  arrival to the hospital, 05/03/2014, showed a glucose level of 113. BUN and creatinine of 48 and 3.07, sodium 131, potassium 5.4, bicarbonate level low at 15. The patient's CBC done 05/04/2014, revealed white blood  cell count 8.8, hemoglobin was 9.2, platelet count 369, absolute neutrophil count was 6.7. Coagulation panel: Pro time was  more than 120, now was more than 15.1.   The patient was admitted to the hospital for further evaluation. He was given vitamin K. His Coumadin was placed on hold. He did not require any fresh frozen plasma administration. His pro time trended down and his pro time was found to be 1.3 on the 05/08/2014. The patient underwent angiogram of his right lower extremity by Dr. Leotis Pain on the 05/06/2014. It was abdominal aortogram with runoff right lower extremity PTA with DCB  to proximal SFA, PTA and stent placement, SFA and AK popliteal artery x 2 PTA with DCB and conventional baloon  to popliteal artery, PTA to distal popliteal artery, TP trunk and peroneal arteries was done.   Postoperatively, the patient did well. However developed acute right knee effusion with pain requiring orthopedic surgeon evaluation and tapping. The patient underwent right knee tap on the 05/07/2014. synovial fluid was cloudy, red colored. It showed 35,700 nucleated cells; however,  91 of them were neutrophils and calcium  pyrophosphate crystals were noted. Steroids was injected. Synovial fluid did not show any growth in three days. It was felt that the patient's right knee effusion was acute pseudogout. Tap, as well as steroid administration resolved his discomfort. The patient was evaluated by physical therapist and recommended rehabilitation placement, where he will be discharged today on 05/10/2014.   In regards to his gangrenous right fifth toe, the patient is to undergo amputation by Dr. Lucky Cowboy in the next few days after discharge. The patient is to follow up with Dr. Lucky Cowboy, as outpatient and make decisions about when that procedure is going to be done. The patient meanwhile is to hold his Coumadin therapy, not to be restarted until after gangrene and Dr. Bunnie Domino  recommendations.   In regards to diabetes mellitus,  the patient is to continue diabetic diet.  The patient was noted to have acute renal failure on arrival to the hospital with creatinine level above 3. The patient was given IV fluids and his kidney function normalized. It returned back to completely normal 1.28, creatinine on on 05/07/2014. It was rechecked after angiogram on the 05/09/2014 and was found to be at baseline 1.64. It is recommended to follow the patient's creatinine levels and make decisions about advancing oral intake, fluid intake if needed. Meanwhile, the patient is to avoid nephrotoxic medications or ACE inhibitors.  The patient is to follow up with his nephrologist per prior recommendations. With rehydration the patient was noted to be progressively anemic with a hemoglobin level dropping down to 6.7 on the 05/08/2014. It was felt to be anemia of chronic disease; however, the patient did have some mild hemarthrosis as mentioned above but since the patient's tap revealed red fluid and quite a lot of red blood cells, the patient was transfused with 2 units of red blood cells after which his hemoglobin level improved to 9.1 on 05/10/2014. It is recommended to follow the patient's hemoglobin levels and make decisions about adding Neupogen if needed. The patient was noted to be acidotic, which was felt to be due to acidosis of chronic kidney failure. He was initiated on oral bicarbonate by nephrologist. The patient is to continue  oral bicarbonate and follow up the nephrologist in the next few days after discharge.   In regards to secondary hyperparathyroidism, the patient's PTH, was checked and found to be 164. Vitamin D was found to be  19.8. If needed. The patient was  reinitiated on ergocalciferol, which he used to take once weekly.   The patient is being discharged in stable condition with the above mentioned medications and follow-up. On the day of discharge, the patient's vital signs, temperature was 98.5, pulse was 65, respiration rate was  20, blood pressure 159/71, saturation 98% on room air at rest.   TIME SPENT: 40 minutes.   ____________________________ Theodoro Grist, MD rv:sg D: 05/10/2014 12:11:00 ET T: 05/10/2014 12:48:02 ET JOB#: [id number]  cc: Theodoro Grist, MD, <Dictator> Dr. Andree Moro, MD  Bailey MD ELECTRONICALLY SIGNED 06/01/2014 12:44

## 2015-03-04 NOTE — Op Note (Signed)
PATIENT NAME:  Jacob Hodges, Jacob Hodges MR#:  [id number] DATE OF BIRTH:  02/21/1923  DATE OF PROCEDURE:  05/07/2014  PREOPERATIVE DIAGNOSIS:  Right knee effusion, severe osteoarthritis.   POSTOPERATIVE DIAGNOSIS: Right knee effusion, severe osteoarthritis.   PROCEDURE: Right knee aspiration.   ANESTHESIA: Local.   SURGEON: Hessie Knows, MD   DESCRIPTION OF PROCEDURE: Procedure was done at patient's bedside. Informed consent had been obtained. The appropriate timeout procedure completed. The knee was prepped first with alcohol, then Betadine. Local anesthetic applied and 18-gauge needle inserted in the superolateral aspect of the knee.  Thirty milliliters of blood-tinged, somewhat cloudy fluid was withdrawn, and through the same needle, 1 mL of Kenalog 40 mg/ mL, 5 mL Sensorcaine 0.5% without epinephrine was injected into the joint. The needle was withdrawn and a Band-Aid applied. Blood loss was minimal.   COMPLICATIONS: None.   SPECIMEN: Synovial fluid, right knee, with orders for Gram stain cell count and culture as well as crystalline analysis. The patient tolerated the procedure well. There were no complications.     ____________________________ Laurene Footman, MD mjm:dd D: 05/07/2014 09:26:39 ET T: 05/07/2014 20:15:05 ET JOB#: [id number]  cc: Laurene Footman, MD, <Dictator> Laurene Footman MD ELECTRONICALLY SIGNED 05/08/2014 7:50

## 2015-03-04 NOTE — Consult Note (Signed)
Brief Consult Note: Diagnosis: diabetic foot infection with osteomyelitis.   Patient was seen by consultant.   Consult note dictated.   Recommend further assessment or treatment.   Orders entered.   Discussed with Attending MD.   Comments: Picc placed Rec vancomycin (dose per pharmacy) and cipro 500 mg po bid and flagyl 500 bid  I have asked RN to swab foot to look for resistant organisms Duration 6 weeks - I can see in fu in 2 weeks and may adjust abx based on cx results.  Electronic Signatures: Angelena Form (MD)  (Signed 28-Aug-15 08:32)  Authored: Brief Consult Note   Last Updated: 28-Aug-15 08:32 by Angelena Form (MD)

## 2015-03-04 NOTE — Op Note (Signed)
PATIENT NAME:  [name], Jacob Hodges MR#:  [id number] DATE OF BIRTH:  06-15-1923  DATE OF PROCEDURE:  07/13/2014  PREOPERATIVE DIAGNOSES:  1.  Peripheral arterial disease with ulceration and gangrene, right lower extremity, status post right 5th toe amputation.  2.  Osteomyelitis in right 5th metatarsal below amputation site with open wound.  3.  Hypertension.   POSTOPERATIVE DIAGNOSES: 1.  Peripheral arterial disease with ulceration and gangrene, right lower extremity, status post right 5th toe amputation.  2.  Osteomyelitis in right 5th metatarsal below amputation site with open wound.  3.  Hypertension.  PROCEDURES:  1.  Debridement of skin, soft tissue muscle and bone of the right foot for 30 sq cm with resection of the 5th metatarsal proximally.  2.  VAC dressing placement, right foot for 30 sq cm.      Surgeon:  Lucky Cowboy  EBL: Minimal  Indication for Procedure:  81 yo AAM wtih osteomyelitis of the right fifth toe.  He has had previous rifth toe amputation.  He needs further debridement to clear the infection.    DESCRIPTION OF PROCEDURE:   Right foot was prepped and draped.  A curvilinear incision was created around his old ampuatation site and the wound below with exposed bone.  The tissue was dissected down sharply to the bone.  The TPS was used to take the fifth metatarsal back near its base.  The wound was debrided with skin soft tissue, muscle and the bone for about 30 cm2.  The wound dimensions were about 7 cm long, 4 cm wide, and 1-2 cm deep.    A VAC sponge was then cut to fit the wound after two 3-0 Vicryl sutures were used to close the subcutaneous tissue, there was still too big of a defect to close the skin and expect it to remain closed. Strips of Ioban were used for occlusive seal and this was obtained. The patient was then awakened from anesthesia and taken to the recovery room in stable condition, having tolerated the procedure well.     ____________________________ Algernon Huxley, MD jsd:DT D: 07/13/2014 15:17:14 ET T: 07/13/2014 15:47:28 ET JOB#: [id number]  cc: Algernon Huxley, MD, <Dictator> Algernon Huxley MD ELECTRONICALLY SIGNED 07/13/2014 16:26

## 2015-03-04 NOTE — Consult Note (Signed)
PATIENT NAME:  Jacob Hodges, Jacob Hodges MR#:  [id number] DATE OF BIRTH:  12/17/1922  DATE OF CONSULTATION:  05/07/2014  REFERRING PHYSICIAN:   CONSULTING PHYSICIAN:  Laurene Footman, MD  REASON FOR CONSULTATION: Right knee effusion, and pain.   HISTORY OF PRESENT ILLNESS: The patient is an 81 year old male with significant vascular disease problems, is admitted with acute renal failure. He was found to have a large effusion to the right knee. He has been on anticoagulation and possibly hemarthrosis. X-ray had been obtained and even on a nonweightbearing x-ray, he was essentially bone-on-bone. I discussed these findings with them.   PHYSICAL EXAMINATION: On exam, he has a varus knee with palpable osteophytes, medial and lateral.  He holds the knee in approximately 20 degrees flexion. There was a large effusion present.  CLINICAL IMPRESSION: Right knee effusion, possibly osteoarthritic, possible hemarthrosis.   PLAN: The knee was aspirated today after informed consent had been obtained. Kenalog and Sensorcaine injected after 30 mL of fluid withdrawn, sent for synovial fluid analysis. I think it is unlikely, but also Gram stain and culture obtained for potential septic joint.  If he did develop a septic joint, he probably would require knee arthroscopy to flush out the knee, but, again, I suspect this more of an arthritic condition aggravated by anticoagulation.  He did get some immediate relief of the aspiration, and hopefully can start ambulating today if allowed after his vascular procedure.     ____________________________ Laurene Footman, MD mjm:ts D: 05/07/2014 09:54:54 ET T: 05/07/2014 15:20:34 ET JOB#: [id number]  cc: Laurene Footman, MD, <Dictator> Laurene Footman MD ELECTRONICALLY SIGNED 05/07/2014 16:44

## 2015-03-04 NOTE — Op Note (Signed)
PATIENT NAME:  Jacob Hodges, SPICKLER MR#:  [id number] DATE OF BIRTH:  November 15, 1922  DATE OF PROCEDURE:  05/06/2014  PREOPERATIVE DIAGNOSES: 1. Peripheral arterial disease with gangrene, right lower extremity.  2. Severe chronic kidney disease with recent worsening and acute renal failure.  3. Coagulopathy due to Coumadin, corrected.  4. Diabetes.  5. Prostate cancer.   POSTOPERATIVE DIAGNOSES: 1. Peripheral arterial disease with gangrene, right lower extremity.  2. Severe chronic kidney disease with recent worsening and acute renal failure.  3. Coagulopathy due to Coumadin, corrected.  4. Diabetes.  5. Prostate cancer.  PROCEDURES: 1. Ultrasound guidance for vascular access, left femoral artery.  2. Catheter placement into right peroneal artery from left femoral approach.  3. Aortogram and selective right lower extremity angiogram.  4. Treatment of proximal superficial femoral artery for proximal segment of occlusion with a 6-mm diameter Lutonix drug-coated angioplasty balloon and 5-mm diameter conventional angioplasty balloon.  5. Percutaneous transluminal angioplasty of mid-to-distal superficial femoral artery with 5-mm diameter angioplasty balloon.  6. Percutaneous transluminal angioplasty of distal superficial femoral artery and above-knee popliteal artery with 5-mm diameter angioplasty balloon.  7. Percutaneous transluminal angioplasty of below-knee popliteal artery with 5-mm diameter angioplasty balloon.  8. Percutaneous transluminal angioplasty of tibioperoneal trunk, distal popliteal artery and peroneal artery with 3-mm diameter angioplasty balloon.  9. Drug-coated Lutonix angioplasty balloon to the popliteal artery, at just below the level of the knee.  10. Self-expanding stent placement for greater than 50% residual stenosis and dissection to the distal superficial femoral artery and above-knee popliteal artery after angioplasty.  11. Subsequent stent placement to the proximal and mid  superficial femoral artery for greater than 50% residual stenosis and dissection after angioplasty.  12. StarClose closure device, left femoral artery.   SURGEON: Algernon Huxley, MD   ANESTHESIA: Local with moderate conscious sedation.   ESTIMATED BLOOD LOSS: 25 mL.   FLUOROSCOPY TIME: 27 minutes.   CONTRAST USED: 100 mL.   INDICATION FOR PROCEDURE: This is an 81 year old African American male with a gangrenous right toe. He has pain in his foot and his noninvasive study showed markedly diminished perfusion on the right. He is brought in for angiography for further evaluation and potential treatment. Risks and benefits were discussed. Informed consent was obtained.   DESCRIPTION OF PROCEDURE: The patient is brought to the vascular suite. Groins were shaved and prepped and a sterile surgical field was created. The procedure splayed two days to renal insufficiency. He was back to his baseline of renal function and he also had coagulopathy was corrected medically and he is back below two risks and benefits discussed. Informed consent was obtained.   DESCRIPTION OF PROCEDURE: The patient is brought to the vascular suite. Groins were shaved and prepped and a sterile surgical field was created. The left femoral head was localized with fluoroscopy. The left femoral artery was visualized under ultrasound and found to be patent. It was then accessed under direct ultrasound guidance without difficulty with a Seldinger needle. A J-wire and 5-French sheath were then placed. A pigtail catheter was placed in the aorta at the L1-L2 level and AP aortogram was performed. This demonstrated a near-occlusive stenosis of the left renal artery in its mid segment. The right renal artery appeared to have 2 smaller renal arteries. It was difficult to delineate the degree of stenosis. The aorta and iliac segments were calcified, but not stenotic. I then crossed the aortic bifurcation with the pigtail catheter and a J-wire, and  advanced to the  right femoral head. Selective right lower extremity angiogram was then performed. This showed an essentially flush occlusion of the right superficial femoral artery. There were some wisps of contrast in the SFA in the mid-to-distal segments. There was then a reconstitution of the popliteal artery just above the knee. It then re-occluded at the level of the knee and very poor runoff was seen distally. This was a grave situation. This patient was not really a surgical candidate for bypass and I elected to go ahead and try to proceed with endovascular revascularization. The patient was systemically heparinized. A 6-French Ansell sheath was placed over a Terumo Advantage wire. With the help of a 65 Kumpe catheter and a Terumo Advantage wire, I was able to gain access to the occluded SFA. I was able to get back into the SFA in the mid-segment; however, it was re-occluded in the distal segment and at Hunter's canal, and was heavily calcified. We were able to cross the occlusion tediously and several catheters and wires were exchanged. I was able to regain access into that area of the popliteal artery. Reconstitution above the knee; however we were then occluded again distally. More tedious catheter and wire work was used to gain access to the occluded popliteal artery below the knee and traverse into the tibioperoneal artery and the peroneal artery. We were able to demonstrate some flow in the peroneal artery, although there was very poor flow distally, and advance a wire into the medial tarsal artery at the ankle. The SFA and popliteal arteries above the knee were treated with a 5-mm diameter angioplasty balloon in multiple areas for the multiple locations of occlusion. I took a 3-mm diameter angioplasty balloon and treated the popliteal artery at the level of occlusion at and below the knee. I then treated the tibioperoneal trunk and peroneal occlusion with a 3-mm balloon to the mid-peroneal artery. A  2.5-mm balloon was then advanced into just above the ankle in the peroneal artery and treated throughout the peroneal artery and tibioperoneal trunk. Despite this, there was still very poor flow distally. It was felt this could be partially due to spasm with the wire still in place but the vessel was quite disease. I replaced an 0.035 wire. I elected to use a Lutonix drug-coated angioplasty balloon for the below-knee popliteal artery for the separate and distinct occlusion in this area of the popliteal artery. There was still residual stenosis. There were large geniculate collaterals that provided flow distally, and I felt was it would still be appropriate to try to optimize treatment to the SFA and above-knee popliteal areas of stenosis and occlusion. After the 5-mm diameter angioplasty balloon, the above-knee popliteal artery and distal SFA still had high-grade stenosis, and I treated this with a 6-mm diameter x 20-cm length self-expanding stent. The mid SFA also had high-grade stenosis at the top of this stent and this traversed up to a flow-limiting dissection in the proximal SFA to treat this lesion, I used a 7-mm diameter x 15-cm in length self-expanding stent. The proximal SFA in an area where did not want the stent for fear of hurting his profunda perfusion, which was the original area of proximal occlusion. It was treated with a 6-mm diameter x 10-cm in length Lutonix drug-coated angioplasty balloon. All of the stents were post dilated with a 6-mm balloon.   Completion angiogram showed pretty good flow through the SFA and popliteal artery to the geniculate level with collaterals. The tibioperoneal trunk and peroneal arteries and distal  popliteal artery remain heavily diseased with minimal flow. At this point, I felt I had improved his perfusion as much as possible from an endovascular standpoint today and elected to terminate the procedure. We had treated the peroneal artery, the tibioperoneal trunk, the  distal popliteal artery, separate and distinct stenoses in the above-knee popliteal artery, and then the distal superficial femoral artery as well as a separate area of calcific disease and occlusion in the mid superficial femoral artery, as well as a separate and distinct flush occlusion of the superficial femoral artery. These multiple areas of stenosis were dramatically limiting flow prior to procedure and this had been improved with our procedure. A StarClose closure device was then deployed in the usual fashion with excellent hemostatic result. The patient tolerated the procedure well and was taken to the recovery room in stable condition.      ____________________________ Algernon Huxley, MD jsd:lm D: 05/06/2014 15:38:18 ET T: 05/07/2014 04:26:27 ET JOB#: [id number]  cc: Algernon Huxley, MD, <Dictator>      addendum.  This procedure was performed on 05/06/2014.  Not the 6/25 listed in the computer. Algernon Huxley MD ELECTRONICALLY SIGNED 05/07/2014 15:13

## 2015-03-04 NOTE — Op Note (Signed)
PATIENT NAME:  Jacob Hodges, Jacob Hodges MR#:  [id number] DATE OF BIRTH:  [birth year]  DATE OF PROCEDURE:  05/19/2014  PREOPERATIVE DIAGNOSES: 1. Gangrene, right fifth toe.  2. Peripheral arterial disease.  3. Chronic kidney disease.   POSTOPERATIVE DIAGNOSES: 1. Gangrene, right fifth toe.  2. Peripheral arterial disease.  3. Chronic kidney disease.   PROCEDURE: Right fifth toe ray amputation.   SURGEON: Leotis Pain, M.D.   ANESTHESIA: MAC with a digital block.   ESTIMATED BLOOD LOSS: Minimal.   INDICATION FOR PROCEDURE: This is an 81 year old gentleman with a gangrenous right fifth toe. He has undergone previous angiogram and partial revascularization of the right lower extremity. He is clearly dead necrotic right fifth toe and this is going to be removed. Risks and benefits were discussed. Informed consent was obtained.   DESCRIPTION OF PROCEDURE: Curvilinear incision was created around the base of the right fifth toe after I instilled solution 30-mL solution of 1% lidocaine and 0.5% Marcaine in a digital block fashion to numb the area then. I then dissected down to the base of the toe removing this. The distal phalanx had already been removed as part of our dissection as it was tenuously hanging by a thread of skin. I then got down to the metatarsal head and removed this with rongeurs. The wound was then irrigated. It was closed with several figure-of-eight 3-0 Vicryl sutures. The skin was closed with several mattress nylon sutures. A Xeroform, fluff Kerlix and wrapped Kerlix were then placed as a dressing. The patient tolerated the procedure well and was taken to the recovery room in stable condition.     ___________________________ Algernon Huxley, MD jsd:dw D: 05/19/2014 16:03:39 ET T: 05/19/2014 20:49:29 ET JOB#: [id number]  cc: Algernon Huxley, MD, <Dictator> Algernon Huxley MD ELECTRONICALLY SIGNED 06/16/2014 12:40

## 2015-03-04 NOTE — Consult Note (Signed)
Brief Consult Note: Diagnosis: severe right knee osteoarthritis, possible hemarthrosis secondary to anticoagulation.   Patient was seen by consultant.   Recommend to proceed with surgery or procedure.   Orders entered.   Comments: plan aspiration and injection of knee in am.  Electronic Signatures: Laurene Footman (MD)  (Signed 26-Jun-15 18:29)  Authored: Brief Consult Note   Last Updated: 26-Jun-15 18:29 by Laurene Footman (MD)

## 2015-03-04 NOTE — H&P (Signed)
PATIENT NAME:  Jacob Hodges, Jacob Hodges MR#:  [id number] DATE OF BIRTH:  [birth year]  DATE OF ADMISSION:  05/03/2014  PRIMARY CARE PHYSICIAN:  Tivis Ringer. Moffitt, MD  REASON FOR ADMISSION:  Gangrene of the toe with coagulopathy and renal failure.   HISTORY OF PRESENT ILLNESS:  This is an 81 year old male who is admitted to [facility] today for planned angiogram. When they did last creatinine it was 3.07 but, most importantly his INR was greater than 15.  Apparently, the patient has been on Coumadin for a deep venous thrombosis which was diagnosed in 2012.   REVIEW OF SYSTEMS:    CONSTITUTIONAL: Positive chills, no fever. Positive weakness.  EYES: No blurred or double vision. Positive glaucoma.  ENT:  No ear pain, hearing loss or postnasal drip.    RESPIRATORY: No cough, wheezing, hemoptysis, chronic obstructive pulmonary disease.  CARDIOVASCULAR:  No chest pain, orthopnea, edema, arrhythmia, dyspnea on exertion, palpitations.  GASTROINTESTINAL: No nausea, vomiting, diarrhea, abdominal pain, melena or ulcers.  GENITOURINARY:  No dysuria or hematuria. ENDOCRINE:  No polyuria or polydipsia.  HEMATOLOGIC AND LYMPHATIC: Positive anemia and easy bruising.  SKIN: Positive gangrene of the right foot pinky toe.  MUSCULOSKELETAL: Positive limited activities.  NEUROLOGICAL:  No history of cerebrovascular accident, transient ischemic attack or seizures.  PSYCHIATRIC: No history of anxiety or depression.   PAST MEDICAL HISTORY: 1.  Hypertension.  2.  Diabetes.  3.  History of prostate cancer.  4.  History of deep vein thrombosis, on Coumadin.  5.  Glaucoma.  6.  Chronic renal failure stage 4.  MEDICATIONS: 1.  Norvasc 10 mg daily.  2.  Norvasc 10 mg daily.  3.  Atenolol 25 mg daily.  4.  Bactrim 1 tablet p.o. b.i.d.  5.  Ciprofloxacin 500 mg q.12 hours.  6.  Coumadin 6 mg daily.  7.  Prevacid 20 mg at bedtime.  8.  Timolol eyedrops.  9.  Travoprost eyedrops.   10.  Brimonidine eyedrops.     FAMILY HISTORY:  Positive for hypertension.    PAST SURGICAL HISTORY:  Hernia repair.    SOCIAL HISTORY: No tobacco, alcohol or drug use.   PHYSICAL EXAMINATION:   VITAL SIGNS: Temperature 98, respirations 16, 95% on room air, blood pressure 137/65, heart rate 55.  GENERAL: The patient is alert and oriented, not in acute distress. HEENT:  Head is atraumatic.  Pupils are equal and reactive.  Sclerae anicteric. Mucous membranes are moist. Oropharynx is clear. NECK: Supple without JVD, carotid bruit, CARDIOVASCULAR: Regular rate and rhythm. No murmurs, gallops or rubs.  PMI is not  displaced.  LUNGS: Clear to auscultation without crackles, rales, rhonchi or wheezing.   ABDOMEN: Bowel sounds are positive. Nontender, nondistended. No hepatosplenomegaly.  EXTREMITIES:  No clubbing, cyanosis or edema.  SKIN:  The patient has gangrene of the right foot pinky toe.  It is all black and eschar,  very foul-smelling.  BACK:  No CVA or vertebral tenderness.    LABORATORY DATA: Sodium 131, potassium 5.4, chloride 106, bicarbonate 15, BUN 48, creatinine 3.07, glucose is 113. INR is greater than 15.   ASSESSMENT AND PLAN: This is an 81 year old male who presented for an angiogram and, unfortunately, was noted to have worsening of his kidney failure as well as hypercoagulopathy.   1.  Coagulopathy. The patient's Coumadin obviously will be held. We actually should discontinue the Coumadin as he had a deep vein thrombosis in 2012, several years ago. The patient will receive vitamin K, recheck a  PT in the a.m.  2.  Hyperkalemia from renal failure.  Will treat the hyperkalemia and repeat later today.  3.  Acute on chronic renal failure. The patient will be seen by nephrology for further evaluation and management. Hold any nephrotoxic agents.  4.  Hypertension.  The patient will continue Norvasc, atenolol.  5.  Diabetes. This seems to be diet-controlled at this time. We will continue ADA diet and sliding  scale. 6.  Hyperlipidemia. Continue pravastatin.  7.  Glaucoma.  Continue the eye drops.  8.  Hyponatremia. Will continue to monitor with some gentle hydration.  9.  The patient is full CODE STATUS.   TIME SPENT:  45 minutes.     ____________________________ Donell Beers. Benjie Karvonen, MD spm:cs D: 05/03/2014 13:41:46 ET T: 05/03/2014 14:29:54 ET JOB#: [id number]  cc: Sital P. Benjie Karvonen, MD, <Dictator> Tivis Ringer. Randel Books, MD Donell Beers MODY MD ELECTRONICALLY SIGNED 05/03/2014 15:31

## 2015-03-12 NOTE — Consult Note (Signed)
PATIENT NAME:  Jacob Hodges, Jacob Hodges MR#:  [id number] DATE OF BIRTH:  Feb 01, 1923  DATE OF CONSULTATION:  12/07/2014  REFERRING PHYSICIAN:   CONSULTING PHYSICIAN:  Leotis Pain, MD  REASON FOR CONSULTATION:  Seizure activity.   HISTORY OF PRESENT ILLNESS:  An 81 year old African-American male with past medical history of subdural hematoma, hypertension, atrial fibrillation, chronic kidney disease, diabetes, glaucoma, presents with seizure-like activity. Questionable seizure activity in the nursing home facility, possibly up to 1 hour in duration. The patient was not on antiepileptics prior to that.  Based on the history no prior history of seizures. The patient was intubated for airway protection, a review of systems unable to obtain.   PAST MEDICAL HISTORY: Subdural due to trauma, hypertension, atrial fibrillation, chronic kidney disease, diabetes, hyperlipidemia, gout, generalized weakness, history of Clostridium difficile colitis.   HOME MEDICATIONS: Have been reviewed.   LABORATORY WORKUP:  Has been reviewed as well. The patient has elevated creatinine at 2.56 with BUN of 66. Glucose was 561. The patient was hypernatremic of 154 upon admission.   NEUROLOGIC: The patient appears to be sedated, does not follow any purposeful commands. When called his name the patient does nod, appears to be appropriately, but no purposeful movements in his upper and lower extremities to verbal command.  Does withdraw from painful stimuli bilaterally.   IMPRESSION: This is an 81 year old male with a recent subdural hematoma, on CAT scan the patient has chronic left subdural hemorrhage with minimal effect, presenting with seizure-like activity. Cause of seizure is likely due to that subdural which is epileptogenic, more so when it becomes chronic. The patient was started on Keppra 500 mg q. 12 hours.  It does not appear like he is having any seizure activity at this time. I agree with continuation of vancomycin and  Zosyn for questionable osteomyelitis on bilateral heels. The patient's creatinine is 2.56, would watch it closely, specifically since Keppra is renally cleared. Would not adjust Keppra dose at this point. Aggressive hydration. The patient is getting an EEG at this point to make sure the patient is not in nonconvulsive status and will update once I get those results.   Thank you. It was a pleasure seeing this patient. Please call with any questions.      ____________________________ Leotis Pain, MD yz:bu D: 12/07/2014 11:47:58 ET T: 12/07/2014 13:01:17 ET JOB#: [id number]  cc: Leotis Pain, MD, <Dictator> Leotis Pain MD ELECTRONICALLY SIGNED 12/20/2014 12:05

## 2015-03-12 NOTE — H&P (Signed)
PATIENT NAME:  Jacob Hodges, ELAHI MR#:  [id number] DATE OF BIRTH:  Jun 10, 1923  DATE OF ADMISSION:  12/07/2014  REFERRING PHYSICIAN: Loney Hering, MD  PRIMARY CARE PHYSICIAN: Mikeal Hawthorne. Brynda Greathouse, MD   ADMISSION DIAGNOSIS: Respiratory failure secondary to status epilepticus.   HISTORY OF PRESENT ILLNESS: This is an 81 year old African American male who presents to the Emergency Department via EMS for a seizure. The patient began seizing at his nursing home and at the most may have been in status for approximately 1 hour prior to arrival in the Emergency Department (this is based on nursing checks while in the nursing home and includes transit time as well). In the Emergency Department, the patient was intubated immediately for airway protection. He was given 2 mg of Versed after which a Versed drip was started for sedation and to stop seizure activity. Once the patient was stabilized, he was found to have acute on chronic kidney injury, leukocytosis, as well as tachycardia and possible fever. These findings prompted the Emergency Department to call for admission.   REVIEW OF SYSTEMS: The patient is intubated and sedated and cannot contribute to his own review of systems.   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   PAST SURGICAL HISTORY: Not available at this time.   SOCIAL HISTORY: The patient is a resident of [facility].   FAMILY HISTORY: Unavailable at this time.   MEDICATIONS: 1.  Alphagan 0.15% ophthalmic solution 1 drop to each eye every morning.  2.  Atenolol 50 mg 1 tablet p.o. daily.  3.  Betimol hemihydrate 0.5% ophthalmic solution 1 drop to each eye every morning.  4.  Norvasc 10 mg 1 tablet p.o. daily.  5.  Pravastatin 20 mg 1 tab p.o.  at bedtime.  6.  Travatan Z 0.004% ophthalmic solution 1 drop to each affected eye at bedtime.   ALLERGIES: No known drug allergies.   PERTINENT LABORATORY RESULTS AND RADIOGRAPHIC FINDINGS: Serum glucose is 561, BUN 66, creatinine 2.56, serum sodium 154, potassium 3.7, chloride 117, bicarbonate is 24, calcium is 9. Troponin 0.04. White blood cell count is 14.3, hemoglobin 7.7, hematocrit 25.3, platelet count 453,000. MCV is 87. INR is 1.1. Urinalysis is negative for infection but does show 2+ blood. Arterial blood gas obtained following intubation shows a pH of 7.54, pCO2 of 26, pO2 of 189 on FiO2 of 40% with a base excess of 1. Chest x-ray shows endotracheal tube 4.5 cm above the carina with no active cardiopulmonary disease. X-ray of the right foot for significant skin breakdown and ulceration shows lytic destruction involving the distal portion of the fourth metatarsal consistent with osteomyelitis.   PHYSICAL EXAMINATION: VITAL SIGNS: Temperature is 97.6, pulse 100, respirations 14, blood pressure 106/43, pulse oximetry is 100% on 40% FiO2 via mechanical ventilation.  GENERAL: The patient is sedated and intubated.  HEENT: Normocephalic, atraumatic in appearance.  EYES: Equal, round, and reactive to light and accommodation. There is arcus senilis present over both eyes, as well as a glassy blue appearance consistent with glaucoma. Mucous membranes are relatively moist.  NECK: Trachea is midline. No adenopathy. Thyroid is nonpalpable, nontender.  CHEST: Symmetric and atraumatic.  CARDIOVASCULAR: Tachycardic rate, normal rhythm. Normal S1, S2. No rubs, clicks, or murmurs appreciated.  LUNGS: Clear to auscultation bilaterally. The patient is mechanically ventilated.  ABDOMEN: Positive bowel sounds. Soft, nontender,  nondistended. No hepatosplenomegaly.  GENITOURINARY: Normal external male genitalia.  MUSCULOSKELETAL: The patient will move when examined, but range of motion is not accessible nor  does the patient cooperate with strength exam.  SKIN: There are no rashes, but the patient does have an eschar over the lateral aspect of his left foot as well as approximately stage III ulcer of the heel of the right foot.  CRANIAL NERVES: The patient did have a gag reflex, but otherwise cranial nerves are not easily assessable.  PSYCHIATRIC: Unable to assess as the patient is sedated and intubated.   ASSESSMENT AND PLAN: This is an 81 year old male admitted for respiratory failure secondary to status epilepticus and sepsis.  1.  Acute respiratory failure. The patient was intubated for airway protection due to status epilepticus. He is no longer seizing. We will try to wean ventilator support as soon as possible.  2.  Status epilepticus, stopped after Versed injection and drip initiation. We should consider loading with an anticonvulsant. I have consulted neurology. The differential diagnosis of the etiology for seizure activity includes hypernatremia due to SIADH from traumatic brain injury or from decreased free water intake as the patient reportedly is able to feed himself, although we should consider that he may not be very adept or capable of obtaining appropriate calorie intake or water intake.  3.  Sepsis. This is possibly secondary to his sacral decubitus and/or his right heel ulcer. The osteomyelitis also may be contributing to sepsis syndrome. Blood cultures have been obtained and the patient has been started on vancomycin and Zosyn. We will consider a urine culture as well if his wound or blood cultures do not appear to grow a causative organism.  4.  Hypertension. Continue atenolol.  5.  Atrial fibrillation. Not currently a candidate for anticoagulation as he has already suffered a subdural hematoma in the past following a fall.  6.  Acute on chronic kidney disease. We will start the patient on gentle hydration.  7.  Hypernatremia. The patient is receiving half-normal saline at maintenance  rate at this time.  8.  Diabetes mellitus type 2. Sliding scale insulin while the patient is hospitalized.  9.  Hyperlipidemia. Continue statin therapy although its utility is questionable as the patient is of an advanced age and overall very debilitated.  10.   Glaucoma. Continue eyedrops per the patient's home regimen.  11.   Deep vein thrombosis prophylaxis, heparin.  12.   Gastrointestinal prophylaxis, none until the patient has been off the ventilator for 48 hours.  13.   The patient is a full code at this time; however, I have also ordered a palliative care consult given the patient's multiple morbid conditions and advanced age.   TIME SPENT ON ADMISSION ORDERS AND CRITICAL PATIENT CARE: Approximately 45 minutes.    ____________________________ Norva Riffle. Marcille Blanco, MD msd:at D: 12/07/2014 09:08:55 ET T: 12/07/2014 10:07:34 ET JOB#: [id number]  cc: Norva Riffle. Marcille Blanco, MD, <Dictator> Norva Riffle DIAMOND MD ELECTRONICALLY SIGNED 12/14/2014 6:58

## 2015-03-12 NOTE — Consult Note (Signed)
   Comments   I met with pt's wife, daughter, and niece. I updated family on pt's status. Per family report, he has rapidly declined over the past few months following his SDH. This is his third recent hospitalization. Family say they were informed that he had "cancer everywhere" and that there were no treatment options. He has been living at [facility] for four months and is now bedbound. Pt is total care. Family feel he is likely approaching end of life. We talked about plan for further workup including MRI. Family instead asked about hospice. We talked extensively about option of comfort care and that he could return to the SNF with hospice or transfer to the Hospice Home. Pt's wife says she wants to make him comfort care, DNR, forgo the MRI, and transfer to the Hospice Home. Orders entered and I have discussed this with the Hospice Liaison who will meet with family tomorrow.  45 minutes  Electronic Signatures: BordersKirt Boys (NP)  (Signed 27-Jan-16 16:25)  Authored: Palliative Care   Last Updated: 27-Jan-16 16:25 by Irean Hong (NP)

## 2015-03-12 NOTE — Discharge Summary (Signed)
PATIENT NAME:  Jacob Hodges, Jacob Hodges MR#:  [id number] DATE OF BIRTH:  Mar 25, 1923  DATE OF ADMISSION:  12/07/2014 DATE OF DISCHARGE:  12/08/2014  DISCHARGE DIAGNOSES: 1.  Acute respiratory failure.  2.  Status epilepticus.  3.  Sepsis.  4.  Hypertension.  5.  Atrial fibrillation.  6.  Acute on chronic kidney disease.  7.  Hypernatremia.  8.  Diabetes mellitus.  9.  Glaucoma.   SECONDARY DIAGNOSES:    CONSULTATIONS: 1.  Palliative care, [place] Jack Phifer, MD  2.  Pulmonary, Mariane Duval, MD  3.  Neurology, Leotis Pain, MD    PROCEDURES AND RADIOLOGY:  1.  EEG on the January 28, showed intermittent generalized slowing. CT scan of the head without contrast in the January 27, showed minimal left frontal parietal subdural hematoma, likely representing residue of previous larger subdural hematoma; chronic atrophy and small vessel ischemic changes; ventricle dilatation may be due to central atrophy, also consider normal pressure hydrocephalus.  2.  Chest x-ray on the January 27, showed endotracheal tube deep measuring 4.5 cm above the carina, no evidence of  active pulmonary disease.  3.  Right foot x-ray on the January 27, showed lytic destruction involving the distal portion of the fourth metatarsal consistent with osteomyelitis.  4.  A major laboratory panel: UA on admission was negative.  5.  Blood cultures x 2, were negative.  6.  Urine culture was negative.   HISTORY AND SHORT HOSPITAL COURSE: The patient is an 81 year old male with above-mentioned medical problems who was admitted for respiratory failure secondary to status epilepticus. Please see Dr. Legrand Como Diamond's dictated history and physical for further details. The patient was intubated for respiratory protection. Pulmonary consultation was obtained and Dr. Flora Lipps, who recommended palliative care consultation considering extremely poor prognosis. Neurology consultation was also obtained with Dr. Leotis Pain who recommended  the same consultation.  Palliative care consultation was obtained with Dr. Izora Gala Phifer, who had discussion with family when patient was made comfort care and was deemed appropriate for hospice home where he is being discharged on the January 28.   On the date of discharge, his vital signs are as follows, temperature 97.7, heart rate 97, per minute; respirations 18, per minute; blood pressure 122/59, and he is saturating 100% on room air.   PHYSICAL EXAMINATION:  On the date of discharge: CARDIOVASCULAR: S1 and S2, no murmurs, gallops.  LUNGS: Clear to auscultation. No wheezing, rales, rhonchi, or crepitation.  ABDOMEN: Soft, benign.  NEUROLOGIC: Poorly responsive, was lethargic. He seems critically ill.    All other physical examination remained at baseline.   DISCHARGE MEDICATIONS:   Subdural hematoma with subsequent traumatic brain injury dysphagia, hypertension, atrial fibrillation, chronic kidney disease, diabetes mellitus type 2, hyperlipidemia, glaucoma, gout, generalized weakness, and history of Clostridium difficile colitis.   DISCHARGE DIET: As tolerated.   DISCHARGE ACTIVITY: As tolerated.   DISCHARGE INSTRUCTIONS AND FOLLOW-UP:  1.  Patient was instructed to take his medication crushed or use liquid when appropriate, may change to rectal route if unable to swallow as needed.  2.  Leave Foley catheter for urinary incontinence or prevention of skin breakdown.   TOTAL TIME DISCHARGING THIS PATIENT: Was 40 minutes.    ____________________________ Lucina Mellow. Manuella Ghazi, MD vss:nt D: 12/08/2014 22:51:02 ET T: 12/08/2014 23:13:35 ET JOB#: [id number]  cc: Joyceann Kruser S. Manuella Ghazi, MD, <Dictator> Lucina Mellow [facility] MD ELECTRONICALLY SIGNED 12/09/2014 10:40

## 2016-02-16 IMAGING — CR RIGHT FIFTH TOE
1 series · 3 of 3 positions shown · non-contrast
Comparison: None.

CLINICAL DATA: Infection

EXAM:
RIGHT FIFTH TOE

[Series 1: ap · 0.17mm/px · 3 of 3 slices shown]
[im 1/3]
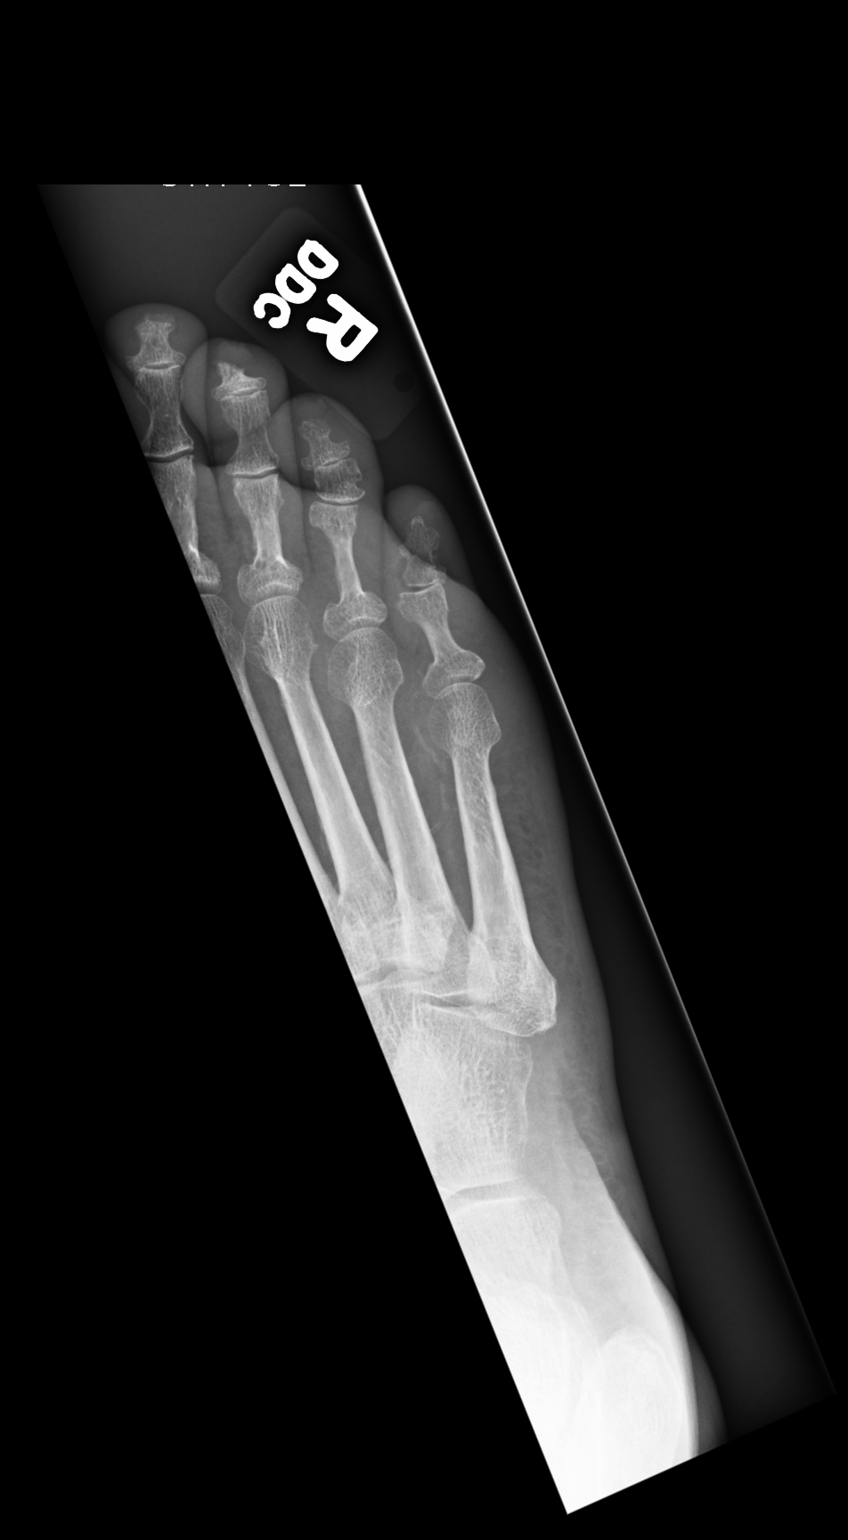
[im 2/3]
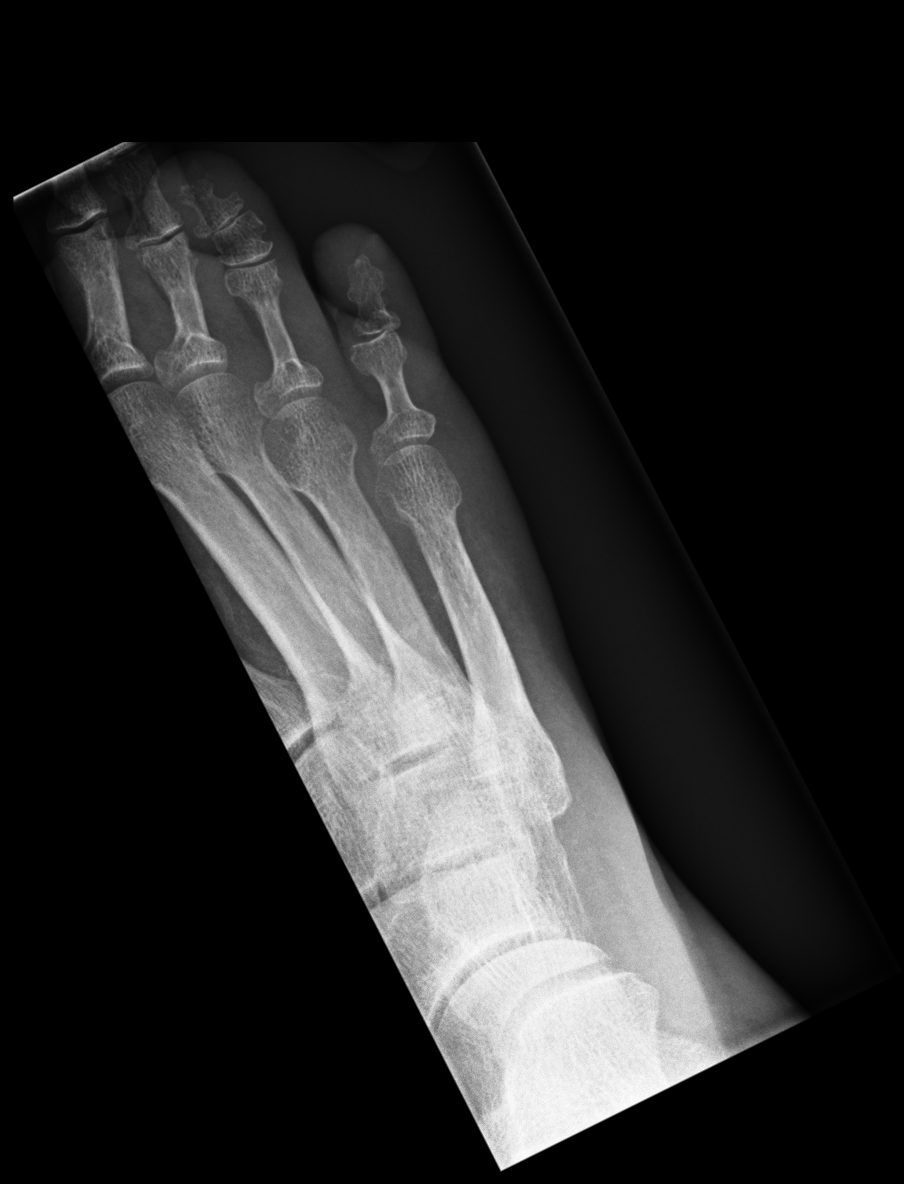
[im 3/3]
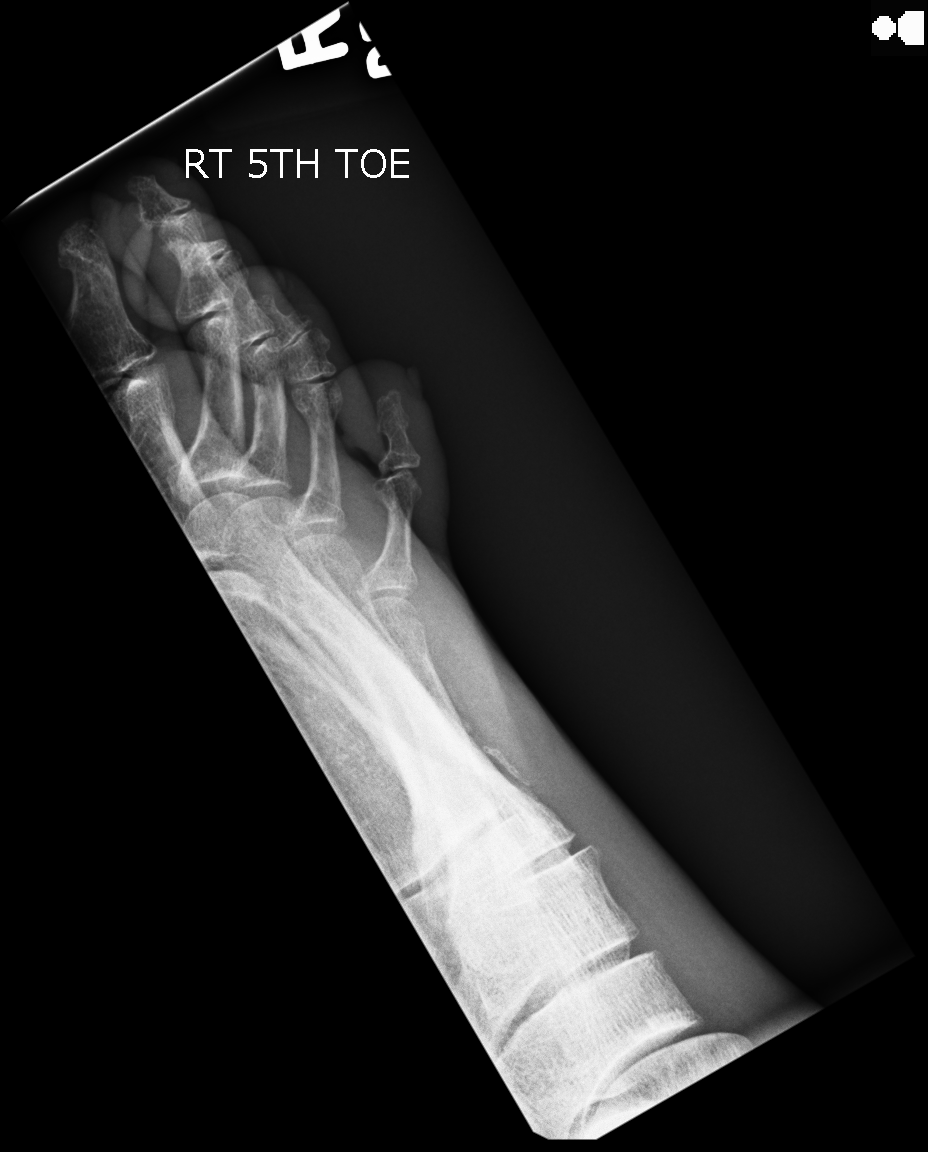

[3 of 3 positions shown; findings below may reference images not displayed]

FINDINGS: Frontal, oblique, and lateral views were obtained. No fracture or
dislocation. No erosive change or bony destruction. Joint spaces
appear intact.
IMPRESSION: No fracture or bony destruction.

## 2016-08-03 IMAGING — CT CT HEAD W/O CM
1 of 2 series · 14 of 30 positions shown, 18 images · non-contrast
Comparison: 10/03/2014 and earlier.

CLINICAL DATA: 89-year-old male with traumatic left subdural
hematoma, being managed conservatively. Initial encounter.

EXAM:
CT HEAD WITHOUT CONTRAST
TECHNIQUE: Contiguous axial images were obtained from the base of the skull
through the vertex without intravenous contrast.

[Series 2: head 5.0 h30s · axial · 0.42mm/px · z∈[-134,-19]mm · 14 of 27 slices shown, 18 images]
[im 2/27  brain]
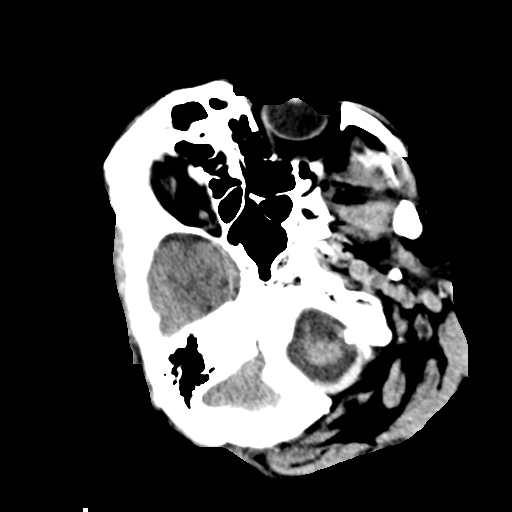
[im 2/27  bone]
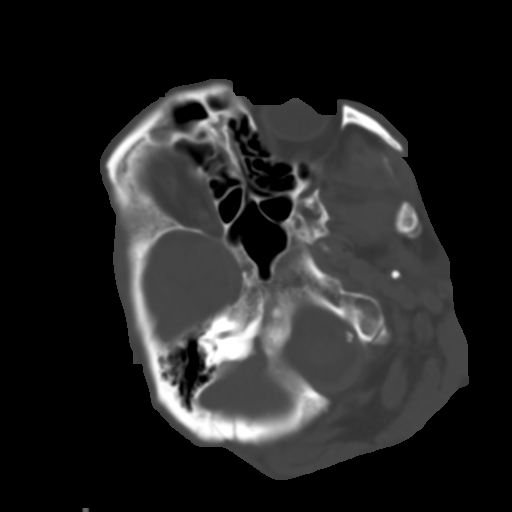
[im 4/27  brain]
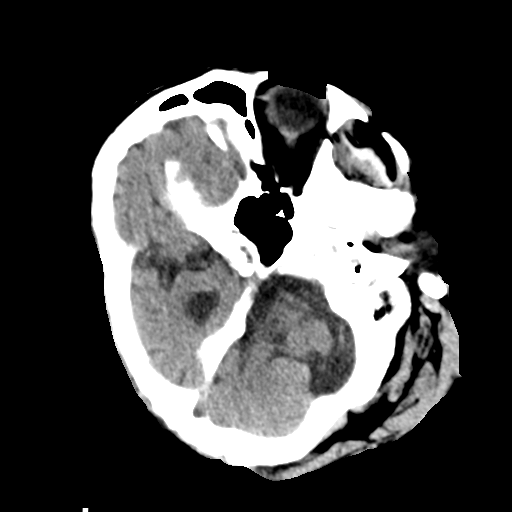
[im 6/27  brain]
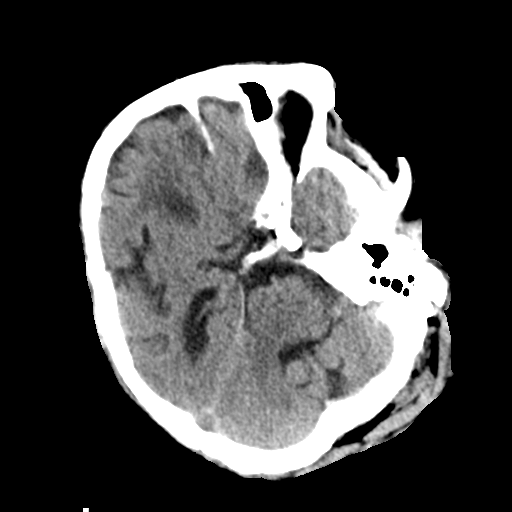
[im 7/27  brain]
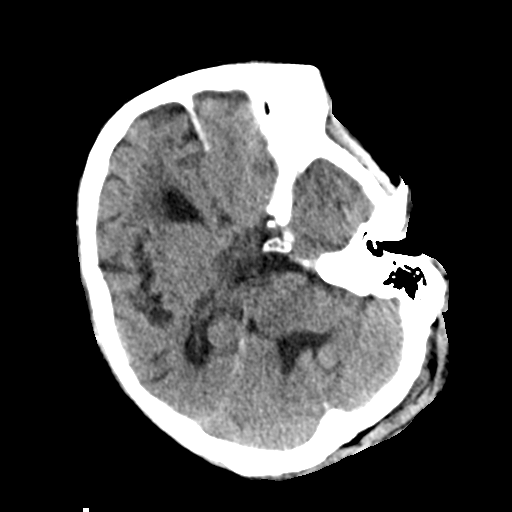
[im 9/27  brain]
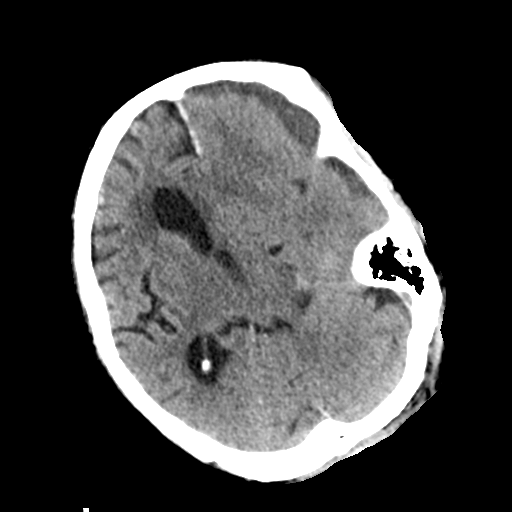
[im 9/27  bone]
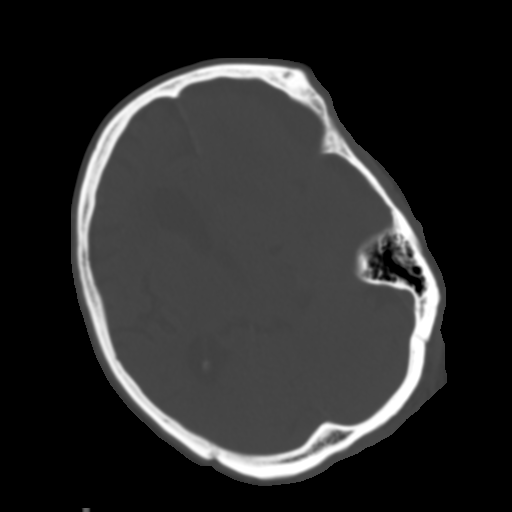
[im 11/27  brain]
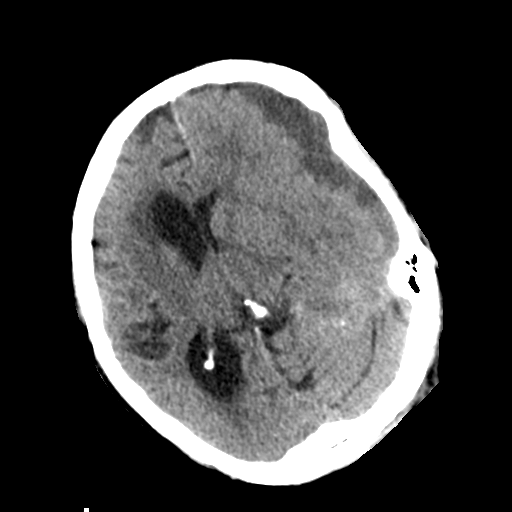
[im 13/27  brain]
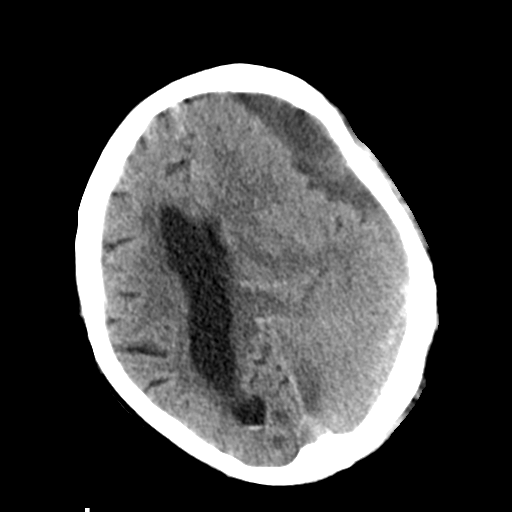
[im 14/27  brain]
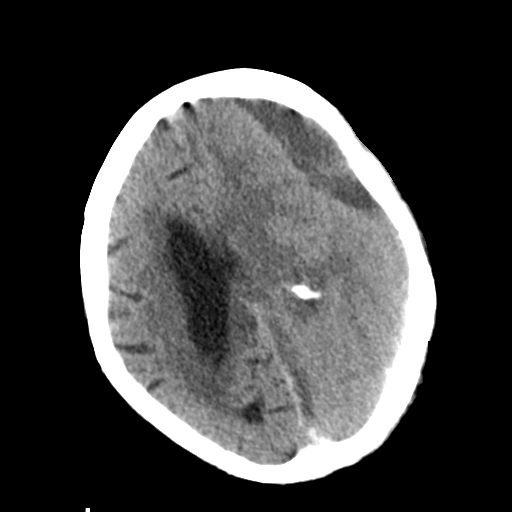
[im 16/27  brain]
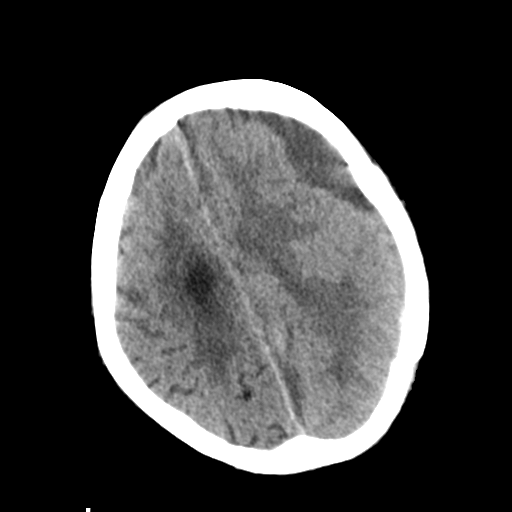
[im 16/27  bone]
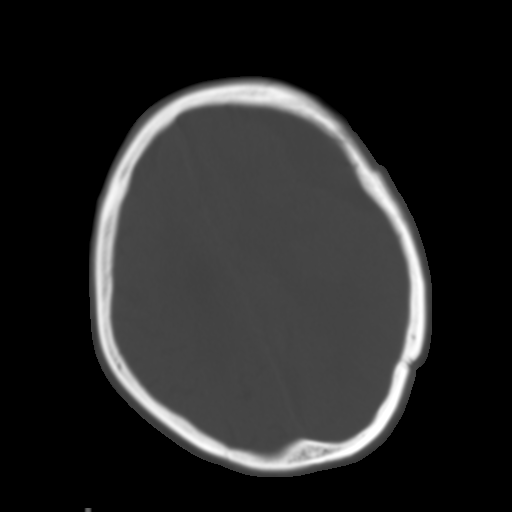
[im 18/27  brain]
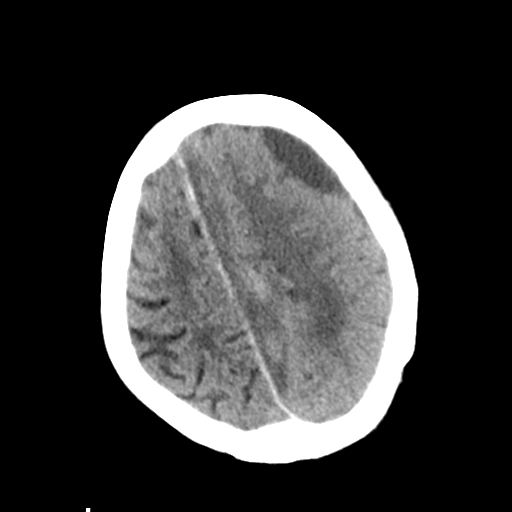
[im 20/27  brain]
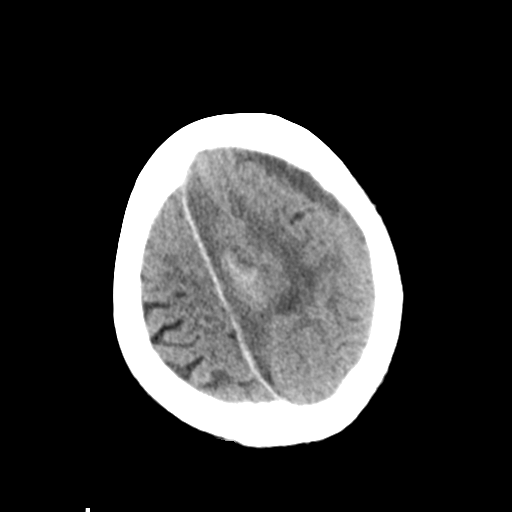
[im 21/27  brain]
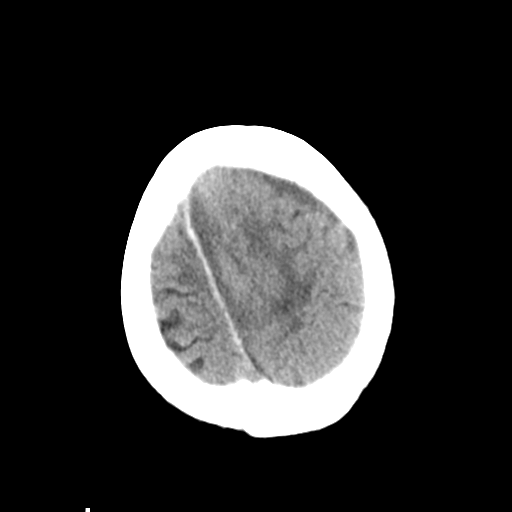
[im 23/27  brain]
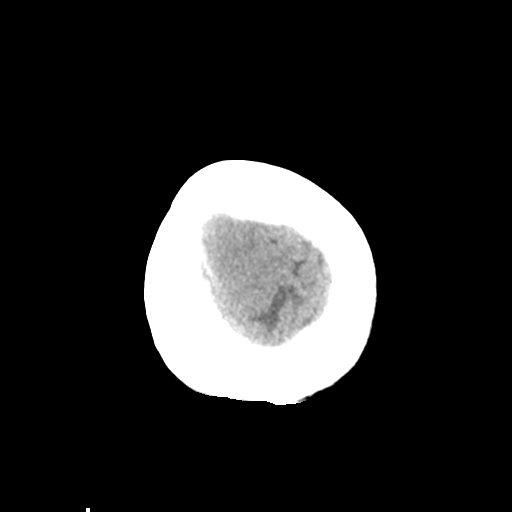
[im 23/27  bone]
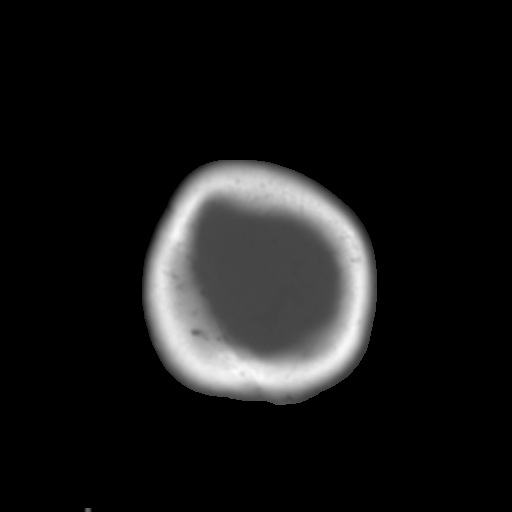
[im 25/27  brain]
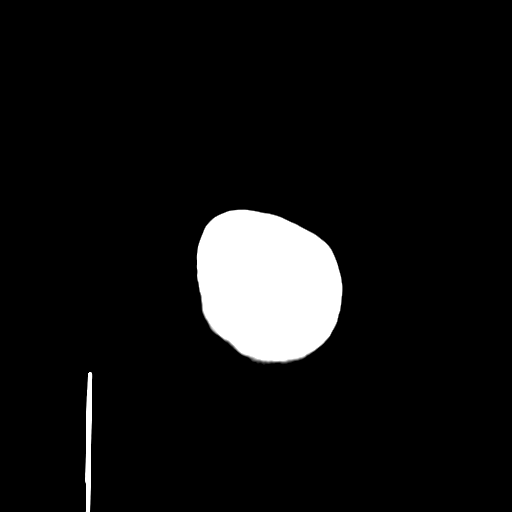

[14 of 30 positions shown; findings below may reference images not displayed]

FINDINGS: No acute osseous abnormality identified. Visualized paranasal
sinuses and mastoids are clear. No acute orbit or scalp soft tissue
findings. Extensive Calcified atherosclerosis at the skull base.

Mixed density left side peripheral and parafalcine hematoma re-
identified. Bulky parafalcine component at the level of the centrum
semiovale has not significantly changed measuring up to 19-20 mm in
maximal thickness. Peripheral component with mass effect most
pronounced on the left anterior frontal lobe also is stable
measuring 18-19 mm in thickness. Rightward midline shift of 9 mm is
stable.

Stable effacement of the left lateral ventricle. Small volume
intraventricular hemorrhage re- identified layering in the right
occipital horn. Stable mild enlargement of the right lateral
ventricle. Stable basilar cisterns. Stable gray-white matter
differentiation. No acute cortically based infarct identified. No
new areas of intracranial hemorrhage identified.
IMPRESSION: 1. Stable left hemisphere parafalcine and peripheral mixed density
subdural hematoma up to 2 cm in thickness.
2. Stable mass effect on the left hemisphere with rightward midline
shift of 9 mm, and effaced left lateral ventricle. Stable mildly
enlarged right lateral ventricle and trace intraventricular
hemorrhage.
3. No new intracranial abnormality identified.

## 2016-10-04 IMAGING — CR RIGHT FOOT - 2 VIEW
1 series · 2 of 2 positions shown · non-contrast
Comparison: July 06, 2014.

CLINICAL DATA: Open wound of foot.

EXAM:
RIGHT FOOT - 2 VIEW

[Series 1: ap · 0.17mm/px · 2 of 2 slices shown]
[im 1/2]
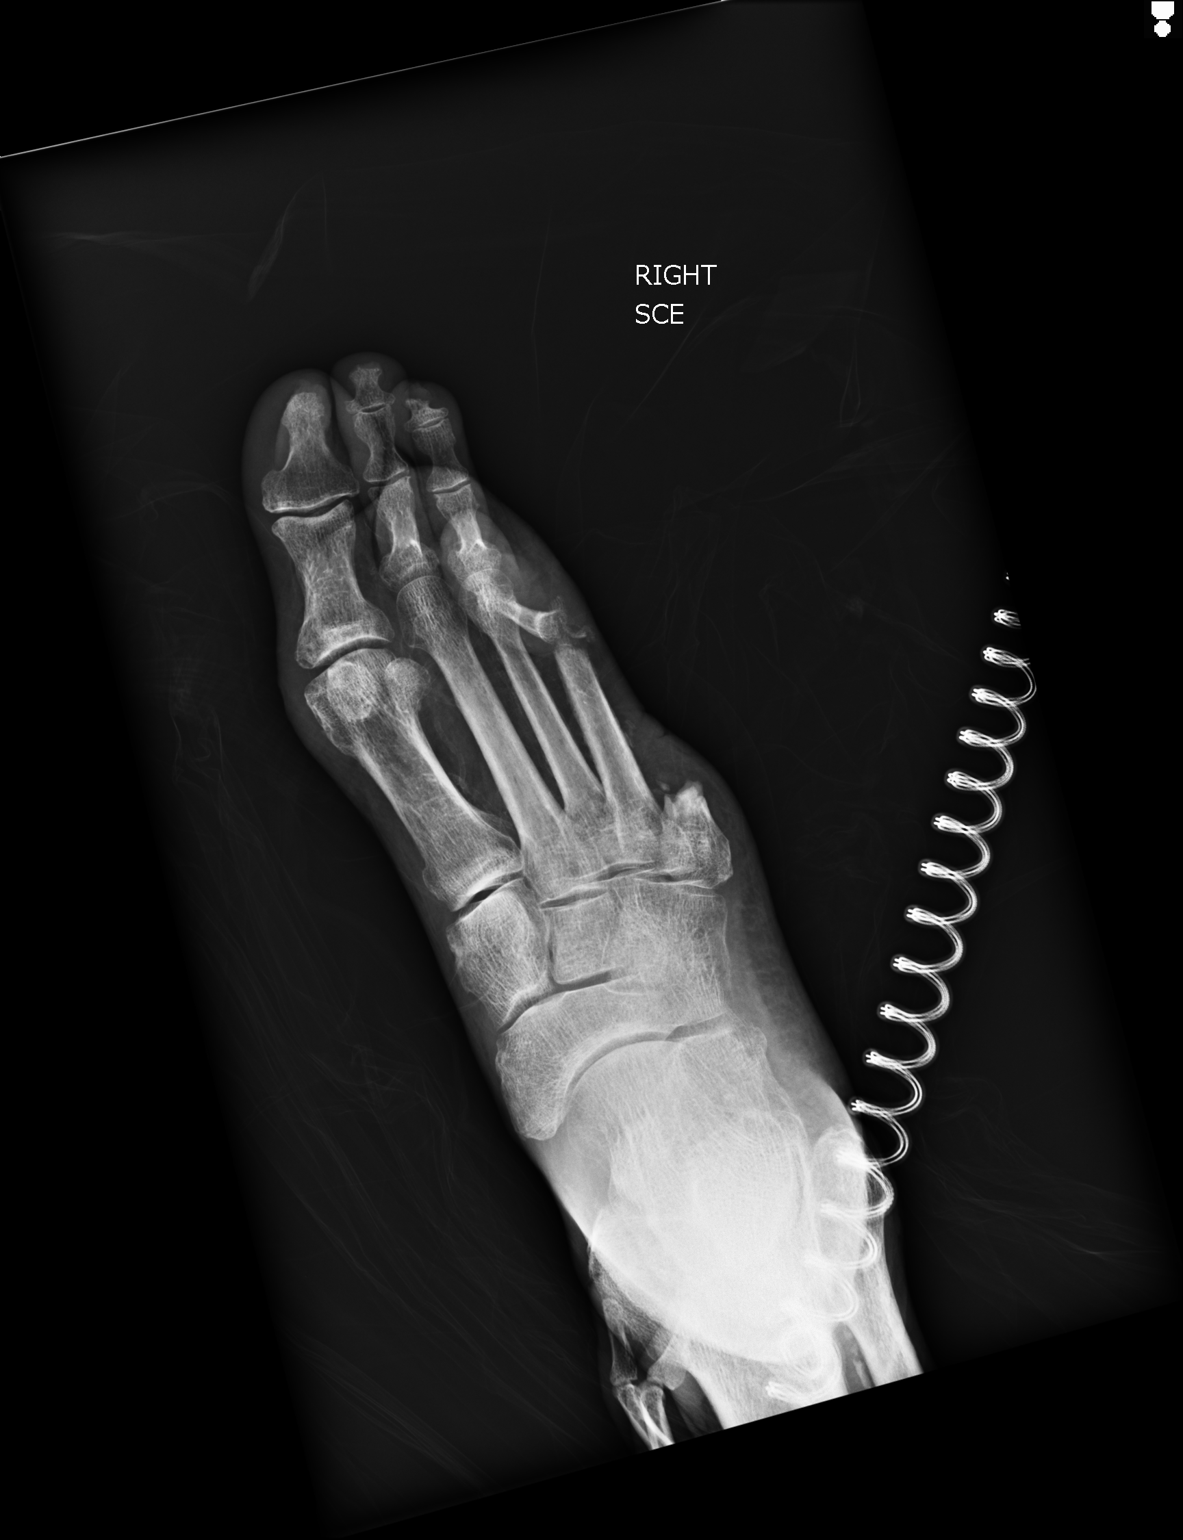
[im 2/2]
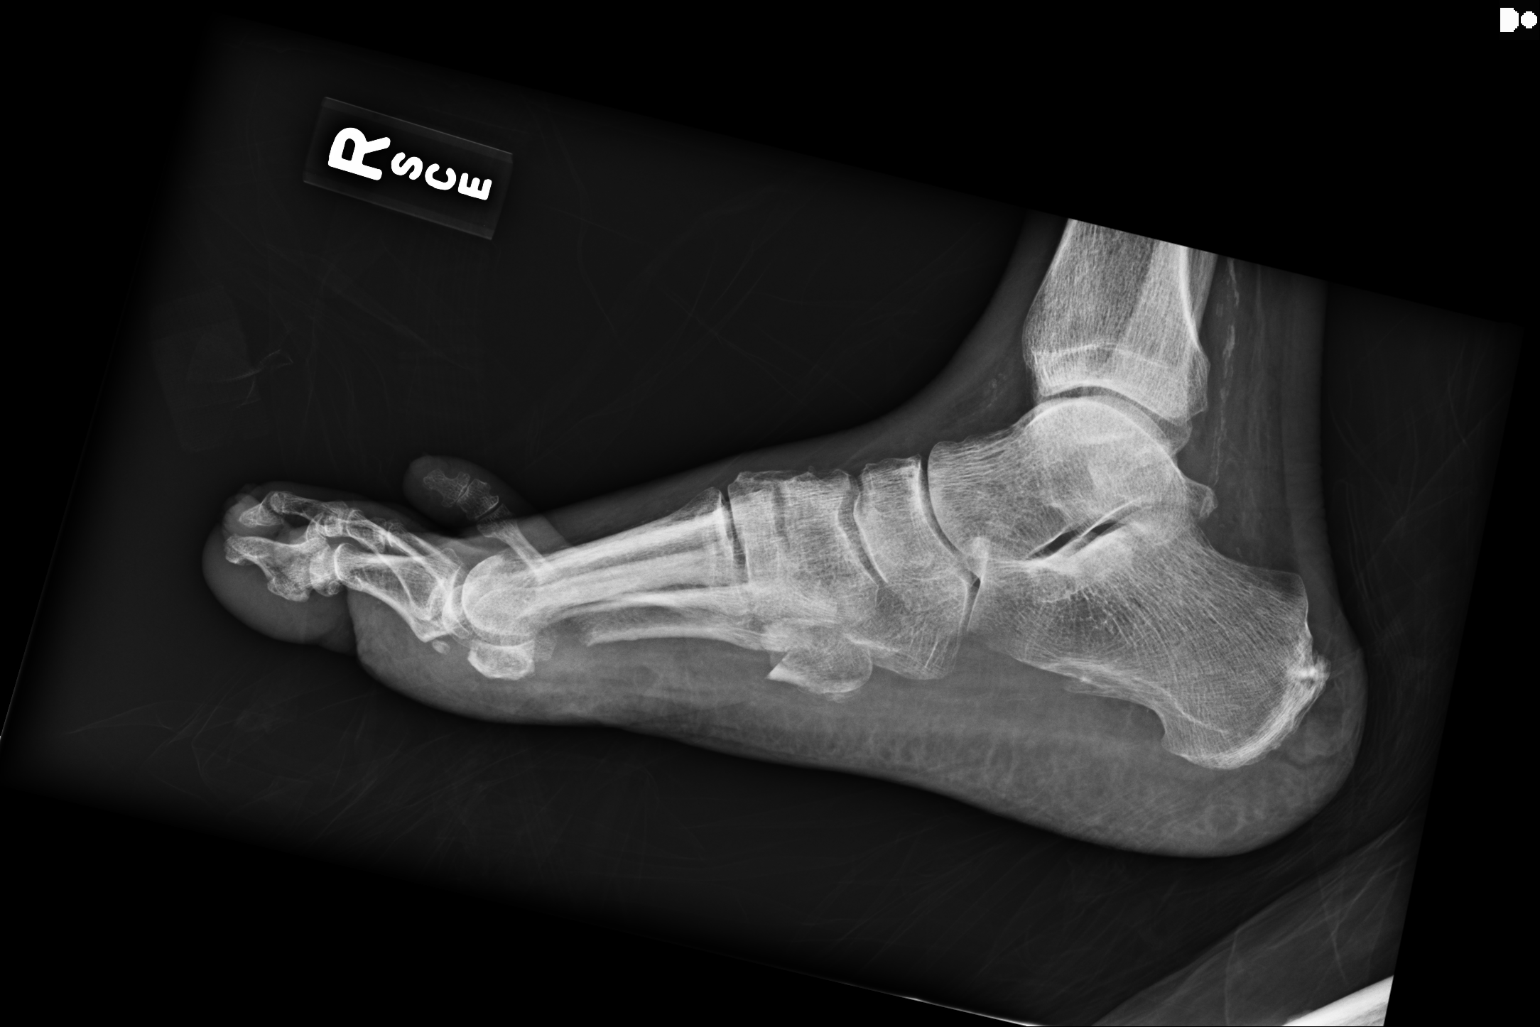

[2 of 2 positions shown; findings below may reference images not displayed]

FINDINGS: Status post surgical amputation of most of the fifth metatarsal.
There is now noted lytic destruction involving the distal portion of
the fourth metatarsal concerning for osteomyelitis. Vascular
calcifications are noted. Minimal osteophyte formation is seen
involving posterior calcaneus.
IMPRESSION: Lytic destruction is seen involving the distal portion of the fourth
metatarsal consistent with osteomyelitis.
